# Patient Record
Sex: Female | Born: 1950
Health system: Southern US, Community
[De-identification: ages and names within clinical notes are randomized; demographics above are authoritative.]

## PROBLEM LIST (undated history)

## (undated) DIAGNOSIS — F411 Generalized anxiety disorder: Secondary | ICD-10-CM

## (undated) DIAGNOSIS — E785 Hyperlipidemia, unspecified: Secondary | ICD-10-CM

## (undated) DIAGNOSIS — I2510 Atherosclerotic heart disease of native coronary artery without angina pectoris: Secondary | ICD-10-CM

## (undated) DIAGNOSIS — F209 Schizophrenia, unspecified: Secondary | ICD-10-CM

## (undated) DIAGNOSIS — J449 Chronic obstructive pulmonary disease, unspecified: Secondary | ICD-10-CM

## (undated) DIAGNOSIS — I10 Essential (primary) hypertension: Secondary | ICD-10-CM

## (undated) DIAGNOSIS — E119 Type 2 diabetes mellitus without complications: Secondary | ICD-10-CM

## (undated) HISTORY — DX: Hyperlipidemia, unspecified: E78.5

## (undated) HISTORY — DX: Generalized anxiety disorder: F41.1

## (undated) HISTORY — DX: Chronic obstructive pulmonary disease, unspecified: J44.9

## (undated) HISTORY — DX: Type 2 diabetes mellitus without complications: E11.9

## (undated) HISTORY — DX: Essential (primary) hypertension: I10

## (undated) HISTORY — DX: Schizophrenia, unspecified: F20.9

## (undated) HISTORY — DX: Atherosclerotic heart disease of native coronary artery without angina pectoris: I25.10

---

## 2016-03-31 DIAGNOSIS — I748 Embolism and thrombosis of other arteries: Secondary | ICD-10-CM | POA: Diagnosis not present

## 2016-03-31 DIAGNOSIS — E119 Type 2 diabetes mellitus without complications: Secondary | ICD-10-CM

## 2016-03-31 DIAGNOSIS — I471 Supraventricular tachycardia: Secondary | ICD-10-CM

## 2016-03-31 DIAGNOSIS — J449 Chronic obstructive pulmonary disease, unspecified: Secondary | ICD-10-CM | POA: Diagnosis not present

## 2016-04-02 DIAGNOSIS — I471 Supraventricular tachycardia: Secondary | ICD-10-CM | POA: Diagnosis not present

## 2016-04-02 DIAGNOSIS — E119 Type 2 diabetes mellitus without complications: Secondary | ICD-10-CM | POA: Diagnosis not present

## 2016-04-02 DIAGNOSIS — I748 Embolism and thrombosis of other arteries: Secondary | ICD-10-CM | POA: Diagnosis not present

## 2016-04-02 DIAGNOSIS — J449 Chronic obstructive pulmonary disease, unspecified: Secondary | ICD-10-CM | POA: Diagnosis not present

## 2016-04-30 DIAGNOSIS — I471 Supraventricular tachycardia, unspecified: Secondary | ICD-10-CM

## 2016-04-30 DIAGNOSIS — I739 Peripheral vascular disease, unspecified: Secondary | ICD-10-CM

## 2016-04-30 DIAGNOSIS — E119 Type 2 diabetes mellitus without complications: Secondary | ICD-10-CM

## 2016-04-30 DIAGNOSIS — R0789 Other chest pain: Secondary | ICD-10-CM | POA: Insufficient documentation

## 2016-04-30 DIAGNOSIS — I10 Essential (primary) hypertension: Secondary | ICD-10-CM

## 2016-04-30 HISTORY — DX: Supraventricular tachycardia: I47.1

## 2016-04-30 HISTORY — DX: Peripheral vascular disease, unspecified: I73.9

## 2016-04-30 HISTORY — DX: Type 2 diabetes mellitus without complications: E11.9

## 2016-04-30 HISTORY — DX: Supraventricular tachycardia, unspecified: I47.10

## 2016-04-30 HISTORY — DX: Essential (primary) hypertension: I10

## 2016-04-30 HISTORY — DX: Other chest pain: R07.89

## 2016-05-23 DIAGNOSIS — R413 Other amnesia: Secondary | ICD-10-CM | POA: Insufficient documentation

## 2016-10-16 DIAGNOSIS — F3170 Bipolar disorder, currently in remission, most recent episode unspecified: Secondary | ICD-10-CM

## 2016-10-16 DIAGNOSIS — I10 Essential (primary) hypertension: Secondary | ICD-10-CM

## 2016-10-16 DIAGNOSIS — J159 Unspecified bacterial pneumonia: Secondary | ICD-10-CM

## 2016-10-16 DIAGNOSIS — F17200 Nicotine dependence, unspecified, uncomplicated: Secondary | ICD-10-CM

## 2016-10-16 DIAGNOSIS — E119 Type 2 diabetes mellitus without complications: Secondary | ICD-10-CM

## 2016-10-16 DIAGNOSIS — J441 Chronic obstructive pulmonary disease with (acute) exacerbation: Secondary | ICD-10-CM

## 2016-10-16 DIAGNOSIS — M1990 Unspecified osteoarthritis, unspecified site: Secondary | ICD-10-CM

## 2016-10-16 DIAGNOSIS — I471 Supraventricular tachycardia: Secondary | ICD-10-CM

## 2016-10-16 DIAGNOSIS — J9601 Acute respiratory failure with hypoxia: Secondary | ICD-10-CM

## 2016-10-16 DIAGNOSIS — I748 Embolism and thrombosis of other arteries: Secondary | ICD-10-CM

## 2016-10-16 DIAGNOSIS — G459 Transient cerebral ischemic attack, unspecified: Secondary | ICD-10-CM | POA: Diagnosis not present

## 2016-10-17 DIAGNOSIS — G459 Transient cerebral ischemic attack, unspecified: Secondary | ICD-10-CM | POA: Diagnosis not present

## 2016-10-17 DIAGNOSIS — J9601 Acute respiratory failure with hypoxia: Secondary | ICD-10-CM | POA: Diagnosis not present

## 2016-10-17 DIAGNOSIS — J159 Unspecified bacterial pneumonia: Secondary | ICD-10-CM | POA: Diagnosis not present

## 2016-10-17 DIAGNOSIS — J441 Chronic obstructive pulmonary disease with (acute) exacerbation: Secondary | ICD-10-CM | POA: Diagnosis not present

## 2017-07-15 DIAGNOSIS — J449 Chronic obstructive pulmonary disease, unspecified: Secondary | ICD-10-CM | POA: Insufficient documentation

## 2017-07-15 DIAGNOSIS — I771 Stricture of artery: Secondary | ICD-10-CM | POA: Insufficient documentation

## 2017-07-15 DIAGNOSIS — E785 Hyperlipidemia, unspecified: Secondary | ICD-10-CM | POA: Insufficient documentation

## 2017-07-15 HISTORY — DX: Stricture of artery: I77.1

## 2018-03-03 DIAGNOSIS — Z862 Personal history of diseases of the blood and blood-forming organs and certain disorders involving the immune mechanism: Secondary | ICD-10-CM | POA: Diagnosis not present

## 2018-03-03 DIAGNOSIS — F17218 Nicotine dependence, cigarettes, with other nicotine-induced disorders: Secondary | ICD-10-CM | POA: Diagnosis not present

## 2018-03-03 DIAGNOSIS — Z9081 Acquired absence of spleen: Secondary | ICD-10-CM

## 2018-09-09 ENCOUNTER — Encounter: Payer: Self-pay | Admitting: Neurology

## 2018-12-02 ENCOUNTER — Ambulatory Visit: Payer: Medicare Other | Admitting: Neurology

## 2018-12-17 ENCOUNTER — Other Ambulatory Visit: Payer: Medicare Other

## 2018-12-17 ENCOUNTER — Other Ambulatory Visit: Payer: Self-pay

## 2018-12-17 ENCOUNTER — Encounter: Payer: Self-pay | Admitting: Neurology

## 2018-12-17 ENCOUNTER — Ambulatory Visit (INDEPENDENT_AMBULATORY_CARE_PROVIDER_SITE_OTHER): Payer: Medicare Other | Admitting: Neurology

## 2018-12-17 VITALS — BP 118/62 | HR 68 | Ht 60.0 in | Wt 165.0 lb

## 2018-12-17 DIAGNOSIS — F0281 Dementia in other diseases classified elsewhere with behavioral disturbance: Secondary | ICD-10-CM

## 2018-12-17 DIAGNOSIS — R413 Other amnesia: Secondary | ICD-10-CM | POA: Diagnosis not present

## 2018-12-17 DIAGNOSIS — F02818 Dementia in other diseases classified elsewhere, unspecified severity, with other behavioral disturbance: Secondary | ICD-10-CM

## 2018-12-17 MED ORDER — DONEPEZIL HCL 5 MG PO TABS: ORAL_TABLET | ORAL | 11 refills | Status: DC

## 2018-12-17 NOTE — Progress Notes (Signed)
NEUROLOGY CONSULTATION NOTE  Jody Livingston MRN: [id number] DOB: November 02, 1950  Referring provider: Mikael Spray, NP Primary care provider: Mikael Spray, NP  Reason for consult:  Epilepsy; depakote level <4  Thank you for your kind referral of Jody Livingston for consultation of the above symptoms. Although her history is well known to you, please allow me to reiterate it for the purpose of our medical record. The patient was accompanied to the clinic by her daughter Jody Livingston who also provides collateral information. Records and images were personally reviewed where available.  HISTORY OF PRESENT ILLNESS: This is a 68 year old left-handed woman with a history of hypertension, hyperlipidemia, diabetes, CAD, schizophrenia, presenting for evaluation of epilepsy and depakote level <4. The patient and her daughter Jody Livingston deny any prior history of epilepsy or seizures. They both deny any prior history of convulsions, staring/unresponsive episodes, or any seizure-like symptoms. There are no prior records for review. She states the Depakote is being prescribed by her psychiatrist Dr. Bayard Males. She states that "I was complaining about memory loss." Jody Livingston does not provide much information and states she does not know much because the patient lives with her other daughter, Jody Livingston. Jody Livingston tried to get in contact with Jody Livingston to provide additional information, and Jody Livingston sent a message: "she sees Dr. Bayard Males for schizophrenia, dementia, COPD, tachycardia, hypertension, diabetes. Why is she not remembering things correctly when supposedly taking all her medication?" The patient states that Jody Livingston lives with her, that the patient pays the rent and utilities and denies missing bills. She stopped driving 2 years ago, it is unclear what exactly occurred, but she denies getting lost and states family would not bring her. Jody Livingston reports she stopped working 25 years ago, she was driving and did not know where  she was going, she would go to work and keep driving, not knowing where she would end up. Jody Livingston reports she forgets to eat. She states she sometimes forgets her medications, and that Jody Livingston does not help her. She puts her medications in a pill caddy and states that her sone comes and "moves them every which way, not in the right places." She states that "he says I don't know what I am doing" and insisted to take her to a psychiatrist. Each time she goes to Dr. Bayard Males and complains about side effects of Depakote, "he asks me to increase it, they are trying to prescribe 4 but I am only taking 1." She states she only started seeing a psychiatrist 2 years ago and that prior to this she had never seen one. Her daughter reminds her that she kept going to the inpatient [facility] hospital in [place] 10 years ago and was discharged home on Risperdal.   She denies any headaches, dizziness, diplopia, dysarthria/dysphagia, neck/back pain, focal numbness/tingling/weakness, bowel/bladder dysfunction, tremors. She has noticed a decreased sense of smell. She has had bedwetting since childhood. She denies any olfactory/gustatory hallucinations, rising epigastric sensation, myoclonic jerks. No family history of dementia or seizures. No history of significant head injuries, alcohol intake. No history of febrile convulsions, CNS infections, neurosurgical procedures.   PAST MEDICAL HISTORY: Past Medical History:  Diagnosis Date  . Diabetes mellitus without complication (HCC)   . Hyperlipidemia   . Schizophrenia (HCC)     PAST SURGICAL HISTORY: History reviewed. No pertinent surgical history.  MEDICATIONS:  Outpatient Encounter Medications as of 12/17/2018  Medication Sig  . atenolol (TENORMIN) 25 MG tablet Take 25 mg by mouth daily.  . clopidogrel (PLAVIX) 75  MG tablet Take 75 mg by mouth daily.  . COMBIVENT RESPIMAT 20-100 MCG/ACT AERS respimat INHALE 1 PUFF BY MOUTH 4 TIMES DAILY  . D3-1000 25 MCG (1000  UT) capsule Take 1,000 Units by mouth daily.  Marland Kitchen diltiazem (CARDIZEM CD) 180 MG 24 hr capsule Take 180 mg by mouth daily.  . divalproex (DEPAKOTE ER) 250 MG 24 hr tablet Take 750 mg by mouth at bedtime.   Marland Kitchen glimepiride (AMARYL) 4 MG tablet TAKE ONE TABLET BY MOUTH EVERY MORNING FOR DIABETES  . metFORMIN (GLUCOPHAGE) 500 MG tablet Take 500 mg by mouth daily.  . montelukast (SINGULAIR) 10 MG tablet Take 10 mg by mouth daily.  . risperiDONE (RISPERDAL) 2 MG tablet Take 2 mg by mouth at bedtime.  . rosuvastatin (CRESTOR) 10 MG tablet Take 10 mg by mouth daily.  Marland Kitchen donepezil (ARICEPT) 5 MG tablet Take 1 tablet daily   No facility-administered encounter medications on file as of 12/17/2018.     ALLERGIES: No Known Allergies  FAMILY HISTORY: History reviewed. No pertinent family history.  SOCIAL HISTORY: Social History   Socioeconomic History  . Marital status: Single    Spouse name: Not on file  . Number of children: Not on file  . Years of education: Not on file  . Highest education level: Not on file  Occupational History  . Not on file  Social Needs  . Financial resource strain: Not on file  . Food insecurity:    Worry: Not on file    Inability: Not on file  . Transportation needs:    Medical: Not on file    Non-medical: Not on file  Tobacco Use  . Smoking status: Unknown If Ever Smoked  Substance and Sexual Activity  . Alcohol use: Not on file  . Drug use: Not on file  . Sexual activity: Not on file  Lifestyle  . Physical activity:    Days per week: Not on file    Minutes per session: Not on file  . Stress: Not on file  Relationships  . Social connections:    Talks on phone: Not on file    Gets together: Not on file    Attends religious service: Not on file    Active member of club or organization: Not on file    Attends meetings of clubs or organizations: Not on file    Relationship status: Not on file  . Intimate partner violence:    Fear of current or ex  partner: Not on file    Emotionally abused: Not on file    Physically abused: Not on file    Forced sexual activity: Not on file  Other Topics Concern  . 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    REVIEW OF SYSTEMS: Constitutional: No fevers, chills, or sweats, no generalized fatigue, change in appetite Eyes: No visual changes, double vision, eye pain Ear, nose and throat: No hearing loss, ear pain, nasal congestion, sore throat Cardiovascular: No chest pain, palpitations Respiratory:  No shortness of breath at rest or with exertion, wheezes GastrointestinaI: No nausea, vomiting, diarrhea, abdominal pain, fecal incontinence Genitourinary:  No dysuria, urinary retention or frequency Musculoskeletal:  No neck pain, back pain Integumentary: No rash, pruritus, skin lesions Neurological: as above Psychiatric: No depression, insomnia, anxiety Endocrine: No palpitations,  fatigue, diaphoresis, mood swings, change in appetite, change in weight, increased thirst Hematologic/Lymphatic:  No anemia, purpura, petechiae. Allergic/Immunologic: no itchy/runny eyes, nasal congestion, recent allergic reactions, rashes  PHYSICAL EXAM: Vitals:   12/17/18 1355  BP: 118/62  Pulse: 68  SpO2: 91%   General: No acute distress Head:  Normocephalic/atraumatic Eyes: Fundoscopic exam shows bilateral sharp discs, no vessel changes, exudates, or hemorrhages Neck: supple, no paraspinal tenderness, full range of motion Back: No paraspinal tenderness Heart: regular rate and rhythm Lungs: Clear to auscultation bilaterally. Vascular: No carotid bruits. Skin/Extremities: No rash, no edema Neurological Exam: Mental status: alert and oriented to person, place, and time, no dysarthria or aphasia, Fund of knowledge is appropriate.  Recent and remote memory are  impaired.  Attention and concentration are normal.    Able to name objects and repeat phrases. Able to name 5 F words in 1 minute (nl >11). CDT 5/5 [name] - Mini Mental State Exam 12/17/2018  Orientation to time 5  Orientation to Place 4  Registration 3  Attention/ Calculation 3  Recall 0  Language- name 2 objects 2  Language- repeat 1  Language- follow 3 step command 3  Language- read & follow direction 1  Write a sentence 1  Copy design 0  Total score 23   Cranial nerves: CN I: not tested CN II: pupils equal, round and reactive to light, visual fields intact, fundi unremarkable. CN III, IV, VI:  full range of motion, no nystagmus, no ptosis CN V: facial sensation intact CN VII: upper and lower face symmetric CN VIII: hearing intact to finger rub CN IX, X: gag intact, uvula midline CN XI: sternocleidomastoid and trapezius muscles intact CN XII: tongue midline Bulk & Tone: normal, no fasciculations. Motor: 5/5 throughout with no pronator drift. Sensation: intact to light touch, cold, pin, vibration and joint position sense.  No extinction to double simultaneous stimulation.  Romberg test negative Deep Tendon Reflexes: +2 throughout, no ankle clonus Plantar responses: downgoing bilaterally Cerebellar: no incoordination on finger to nose, heel to shin. No dysdiadochokinesia Gait: narrow-based and steady, able to tandem walk adequately. Tremor: none  IMPRESSION: This is a 68 year old left-handed woman with a history of hypertension, hyperlipidemia, diabetes, CAD, schizophrenia, presenting for evaluation of epilepsy and depakote level <4. The patient and her daughter deny any history of epilepsy or seizures, there are no records on EPIC about this. There are no clear symptoms to indicate she has had seizures. She is on Depakote for schizophrenia and has a subtherapeutic level because she states she is only taking 1 tablet despite being instructed to take 4. It appears she is here for  memory concerns, [name] today 23/30. On further questioning, memory issues seem to date back to 25 years ago and may relate as well to underlying psychiatric condition. She has a diagnosis of schizophrenia, unclear if she has bipolar disorder as well, ndividuals with bipolar disorder are more likely to develop dementia as they age, and this typically involves frontal-subcortical pattern of deficits. Bloodwork for TSH and B12 will be ordered, MRI brain with and without contrast will be done to assess for underlying structural abnormality. She is agreeable to starting Donepezil 5mg  daily, side effects and expectations from the medication were discussed. She was advised to start using a Pillpack to help with medication management since she seems suspicious of her children helping her. Continue follow-up with Psychiatry. She does not drive. Follow-up in 6 months, call for any changes.   Thank you  for allowing me to participate in the care of this patient. Please do not hesitate to call for any questions or concerns.   Patrcia Dolly, M.D.  CC: Mikael Spray, NP

## 2018-12-17 NOTE — Patient Instructions (Addendum)
1. Bloodwork for TSH, B12  Your provider requests that you have LABS drawn today.  We share a lab with [facility] - they are located in [address] (second floor) of this building.  Once you get there, please have a seat and the phlebotomist will call your name.  If you have waited more than 15 minutes, please advise the front desk  2. Schedule MRI brain with and without contrast  We have sent a referral to [facility] for your MRI and they will call you directly to schedule your appt. They are located at [address] [facility]. If you need to contact them directly please call [phone].  3. Start Donepezil 5mg  daily to help slow down worsening of memory 4. Recommend doing Pillpack to help with organizing medications. Look into [email or web address] 5. Continue working with your psychiatrst 6. Follow-up in 6 months, call for any changes  FALL PRECAUTIONS: Be cautious when walking. Scan the area for obstacles that may increase the risk of trips and falls. When getting up in the mornings, sit up at the edge of the bed for a few minutes before getting out of bed. Consider elevating the bed at the head end to avoid drop of blood pressure when getting up. Walk always in a well-lit room (use night lights in the walls). Avoid area rugs or power cords from appliances in the middle of the walkways. Use a walker or a cane if necessary and consider physical therapy for balance exercise. Get your eyesight checked regularly.  HOME SAFETY: Consider the safety of the kitchen when operating appliances like stoves, microwave oven, and blender. Consider having supervision and share cooking responsibilities until no longer able to participate in those. Accidents with firearms and other hazards in the house should be identified and addressed as well.  ABILITY TO BE LEFT ALONE: If patient is unable to contact 911 operator, consider using LifeLine, or when the need is there, arrange for someone to stay with  patients. Smoking is a fire hazard, consider supervision or cessation. Risk of wandering should be assessed by caregiver and if detected at any point, supervision and safe proof recommendations should be instituted.  MEDICATION SUPERVISION: Inability to self-administer medication needs to be constantly addressed. Implement a mechanism to ensure safe administration of the medications.  RECOMMENDATIONS FOR ALL PATIENTS WITH MEMORY PROBLEMS: 1. Continue to exercise (Recommend 30 minutes of walking everyday, or 3 hours every week) 2. Increase social interactions - continue going to [place] and enjoy social gatherings with friends and family 3. Eat healthy, avoid fried foods and eat more fruits and vegetables 4. Maintain adequate blood pressure, blood sugar, and blood cholesterol level. Reducing the risk of stroke and cardiovascular disease also helps promoting better memory. 5. Avoid stressful situations. Live a simple life and avoid aggravations. Organize your time and prepare for the next day in anticipation. 6. Sleep well, avoid any interruptions of sleep and avoid any distractions in the bedroom that may interfere with adequate sleep quality 7. Avoid sugar, avoid sweets as there is a strong link between excessive sugar intake, diabetes, and cognitive impairment The Mediterranean diet has been shown to help patients reduce the risk of progressive memory disorders and reduces cardiovascular risk. This includes eating fish, eat fruits and green leafy vegetables, nuts like almonds and hazelnuts, walnuts, and also use olive oil. Avoid fast foods and fried foods as much as possible. Avoid sweets and sugar as sugar use has been linked to worsening of memory function.

## 2018-12-18 LAB — VITAMIN B12: VITAMIN B 12: 518 pg/mL (ref 200–1100)

## 2018-12-18 LAB — TSH: TSH: 2.63 mIU/L (ref 0.40–4.50)

## 2018-12-19 ENCOUNTER — Telehealth: Payer: Self-pay

## 2018-12-19 NOTE — Telephone Encounter (Signed)
LMOM advising pt of normal results below.

## 2018-12-19 NOTE — Telephone Encounter (Signed)
-----   Message from Van Clines, MD sent at 12/18/2018 11:12 PM EST ----- Pls let her know thyroid and B12 levels are normal. Thanks

## 2018-12-23 ENCOUNTER — Encounter: Payer: Self-pay | Admitting: Neurology

## 2018-12-31 ENCOUNTER — Telehealth: Payer: Self-pay | Admitting: Neurology

## 2018-12-31 NOTE — Telephone Encounter (Signed)
Noted  

## 2018-12-31 NOTE — Telephone Encounter (Signed)
Patient's daughter in law called on her behalf. She is not listed on the DPR. She said she would get her Daughter to call back to speak with you. She said Talishia said she missed a call from our office? She also has told her family that she was to stop taking her medication (All but one). Please Advise. Thanks

## 2019-01-20 ENCOUNTER — Other Ambulatory Visit: Payer: Self-pay

## 2019-01-20 ENCOUNTER — Ambulatory Visit
Admission: RE | Admit: 2019-01-20 | Discharge: 2019-01-20 | Disposition: A | Discharge status: Home / Self Care | Payer: Medicare Other | Source: Ambulatory Visit | Attending: Neurology | Admitting: Neurology

## 2019-01-20 DIAGNOSIS — R413 Other amnesia: Secondary | ICD-10-CM

## 2019-01-20 MED ORDER — GADOBENATE DIMEGLUMINE 529 MG/ML IV SOLN
15.0000 mL | Freq: Once | INTRAVENOUS | Status: AC | PRN
  Administered 2019-01-20: 15 mL via INTRAVENOUS

## 2019-01-27 ENCOUNTER — Other Ambulatory Visit: Payer: Medicare Other

## 2019-02-09 ENCOUNTER — Telehealth: Payer: Self-pay | Admitting: Neurology

## 2019-02-09 NOTE — Telephone Encounter (Signed)
Please advise 

## 2019-02-09 NOTE — Telephone Encounter (Signed)
Left message with answering service regarding needing to get MRI results. Please Call. Thanks

## 2019-02-09 NOTE — Telephone Encounter (Signed)
See below:  Notes recorded by Van Clines, MD on 01/23/2019 at 11:04 AM EDT Pls let patient/daughter know the MRI brain did not show any evidence of tumor, stroke,or bleed. It showed age-related changes, similar to prior scan in 2017. Thanks  Thanks!

## 2019-02-09 NOTE — Telephone Encounter (Signed)
Contacted patient daughter and went over resutls. She repeated back what was said verbatum and verbalized understanding

## 2019-07-28 ENCOUNTER — Ambulatory Visit: Payer: Medicare Other | Admitting: Neurology

## 2019-07-30 ENCOUNTER — Encounter: Payer: Self-pay | Admitting: Neurology

## 2019-07-30 ENCOUNTER — Telehealth (INDEPENDENT_AMBULATORY_CARE_PROVIDER_SITE_OTHER): Payer: Medicare Other | Admitting: Neurology

## 2019-07-30 ENCOUNTER — Other Ambulatory Visit: Payer: Self-pay

## 2019-07-30 VITALS — Ht 60.0 in | Wt 147.0 lb

## 2019-07-30 DIAGNOSIS — F02818 Dementia in other diseases classified elsewhere, unspecified severity, with other behavioral disturbance: Secondary | ICD-10-CM

## 2019-07-30 DIAGNOSIS — F0281 Dementia in other diseases classified elsewhere with behavioral disturbance: Secondary | ICD-10-CM

## 2019-07-30 DIAGNOSIS — R413 Other amnesia: Secondary | ICD-10-CM

## 2019-07-30 MED ORDER — DONEPEZIL HCL 10 MG PO TABS: 10.0000 mg | ORAL_TABLET | Freq: Every day | ORAL | 3 refills | Status: DC

## 2019-07-30 NOTE — Progress Notes (Signed)
Virtual Visit via Video Note The purpose of this virtual visit is to provide medical care while limiting exposure to the novel coronavirus.    Consent was obtained for video visit:  Yes.   Answered questions that patient had about telehealth interaction:  Yes.   I discussed the limitations, risks, security and privacy concerns of performing an evaluation and management service by telemedicine. I also discussed with the patient that there may be a patient responsible charge related to this service. The patient expressed understanding and agreed to proceed.  Pt location: Home Physician Location: office Name of referring provider:  Welford Roche, NP I connected with Jody Livingston at patients initiation/request on 07/30/2019 at 10:00 AM EDT by video enabled telemedicine application and verified that I am speaking with the correct person using two identifiers. Pt MRN:  [id number] Pt DOB:  [birth year] Video Participants:  Jody Livingston;  Jody Livingston (daughter)   History of Present Illness:  The patient was seen as a virtual video visit on 07/30/2019. She was last seen 8 months ago for evaluation of "epilepsy and a depakote level <4," but on further questioning there is no history of seizures, she is on depakote for mood stabilization. Main concern from her other daughter Jody Livingston was memory loss. She was previously living with Jody Livingston, but today reports that she is now living alone. Her daughter Jody Livingston is again present during the visit and is unable to provide any history, stating she does not know much because she is not around her much. It is unclear if Jody Livingston is unable to be more forthcoming. On her initial visit, [name] was 23/30. I personally reviewed MRI brain without contrast done 12/2018 which did not show any acute changes, there was mild diffuse atrophy and chronic microvascular disease. TSH and B12 normal. She was started on Donepezil 5mg  daily which she is tolerating without side  effects. She states her memory is not good. Medications now come in a pillpack which has helped track it down better, but she still misses doses, usually the night medications. She does not drive. She does not cook. She denies missing bill payments. Jody Livingston is unable to confirm information. She continues to see Psychiatry. She denies any headaches, dizziness, vision changes, focal numbness/tingling/weakness, no falls.   History on Initial Assessment 12/17/2018: This is a 68 year old left-handed woman with a history of hypertension, hyperlipidemia, diabetes, CAD, schizophrenia, presenting for evaluation of epilepsy and depakote level <4. The patient and her daughter Jody Livingston deny any prior history of epilepsy or seizures. They both deny any prior history of convulsions, staring/unresponsive episodes, or any seizure-like symptoms. There are no prior records for review. She states the Depakote is being prescribed by her psychiatrist Jody Livingston. She states that "I was complaining about memory loss." Jody Livingston does not provide much information and states she does not know much because the patient lives with her other daughter, Jody Livingston. Jody Livingston tried to get in contact with Jody Livingston to provide additional information, and Jody Livingston sent a message: "she sees Jody Livingston for schizophrenia, dementia, COPD, tachycardia, hypertension, diabetes. Why is she not remembering things correctly when supposedly taking all her medication?" The patient states that Jody Livingston lives with her, that the patient pays the rent and utilities and denies missing bills. She stopped driving 2 years ago, it is unclear what exactly occurred, but she denies getting lost and states family would not bring her. Jody Livingston reports she stopped working 25 years ago, she was driving and did not  know where she was going, she would go to work and keep driving, not knowing where she would end up. Jody Livingston reports she forgets to eat. She states she sometimes forgets her  medications, and that Jody Livingston does not help her. She puts her medications in a pill caddy and states that her sone comes and "moves them every which way, not in the right places." She states that "he says I don't know what I am doing" and insisted to take her to a psychiatrist. Each time she goes to Jody Livingston and complains about side effects of Depakote, "he asks me to increase it, they are trying to prescribe 4 but I am only taking 1." She states she only started seeing a psychiatrist 2 years ago and that prior to this she had never seen one. Her daughter reminds her that she kept going to the inpatient Psychiatric hospital in [place] 10 years ago and was discharged home on Risperdal.   She denies any headaches, dizziness, diplopia, dysarthria/dysphagia, neck/back pain, focal numbness/tingling/weakness, bowel/bladder dysfunction, tremors. She has noticed a decreased sense of smell. She has had bedwetting since childhood. She denies any olfactory/gustatory hallucinations, rising epigastric sensation, myoclonic jerks. No family history of dementia or seizures. No history of significant head injuries, alcohol intake. No history of febrile convulsions, CNS infections, neurosurgical procedures.      Current Outpatient Medications on File Prior to Visit  Medication Sig Dispense Refill   clopidogrel (PLAVIX) 75 MG tablet Take 75 mg by mouth daily.     COMBIVENT RESPIMAT 20-100 MCG/ACT AERS respimat INHALE 1 PUFF BY MOUTH 4 TIMES DAILY     D3-1000 25 MCG (1000 UT) capsule Take 1,000 Units by mouth daily.     diltiazem (CARDIZEM CD) 180 MG 24 hr capsule Take 180 mg by mouth daily.     divalproex (DEPAKOTE ER) 250 MG 24 hr tablet Take 750 mg by mouth at bedtime.      donepezil (ARICEPT) 5 MG tablet Take 1 tablet daily 30 tablet 11   glimepiride (AMARYL) 4 MG tablet TAKE ONE TABLET BY MOUTH EVERY MORNING FOR DIABETES     metFORMIN (GLUCOPHAGE) 500 MG tablet Take 500 mg by mouth daily.      montelukast (SINGULAIR) 10 MG tablet Take 10 mg by mouth daily.     risperiDONE (RISPERDAL) 0.5 MG tablet Take 0.5 mg by mouth at bedtime.     risperidone (RISPERDAL) 4 MG tablet Take 4 mg by mouth 2 (two) times daily.     rosuvastatin (CRESTOR) 10 MG tablet Take 10 mg by mouth daily.     No current facility-administered medications on file prior to visit.      Observations/Objective:   Vitals:   07/30/19 0932  Weight: 147 lb (66.7 kg)  Height: 5' (1.524 m)   GEN:  The patient appears stated age and is in NAD.  Neurological examination: Patient is awake, alert, oriented x 2. No aphasia or dysarthria. Reduced fluency, able to follow commands. Remote and recent memory impaired. Able to repeat. Cranial nerves: Extraocular movements intact with no nystagmus. No facial asymmetry. Motor: moves all extremities symmetrically, at least anti-gravity x 4.   Montreal Cognitive Assessment Blind 07/30/2019  Attention: Read list of digits (0/2) 2  Attention: Read list of letters (0/1) 1  Attention: Serial 7 subtraction starting at 100 (0/3) 0  Language: Repeat phrase (0/2) 1  Language : Fluency (0/1) 0  Abstraction (0/2) 0  Delayed Recall (0/5) 0  Orientation (0/6) 3  Total 7  Adjusted Score (based on education) 8    Assessment and Plan:   This is a 68 yo LH woman with a history of hypertension, hyperlipidemia, diabetes, CAD, schizophrenia, who initially presented for evaluation of epilepsy and depakote level <4, but on further questioning, she has no history of epilepsy or seizures and takes Depakote for mood stabilization. It appears the main concern is memory, which seem to date back to 25 years ago and may relate as well to underlying psychiatric condition. She has a diagnosis of schizophrenia, unclear if she has bipolar disorder as well, individuals with bipolar disorder are more likely to develop dementia as they age, and this typically involves frontal-subcortical pattern of deficits. MRI  brain unremarkable. Her MOCA blind (done over phone) today is 8/22 ([name] 23/30 in February 2020). She is on Donepezil 5mg  daily, increase to 10mg  daily. She is now living alone, unclear if due to issues with family, it appears her other daughter present today Jody PenSherry is unable to be more forthcoming with information. She will be scheduled for Neurocognitive testing to further evaluate memory concerns to help guide long-term management. Continue follow-up with Psychiatry. She does not drive. Follow-up in 6 months, call for any changes.    Follow Up Instructions:   -I discussed the assessment and treatment plan with the patient. The patient was provided an opportunity to ask questions and all were answered. The patient agreed with the plan and demonstrated an understanding of the instructions.   The patient was advised to call back or seek an in-person evaluation if the symptoms worsen or if the condition fails to improve as anticipated.    Van ClinesKaren M Thelton Graca, MD

## 2019-08-31 ENCOUNTER — Encounter: Payer: Self-pay | Admitting: Psychology

## 2019-08-31 ENCOUNTER — Ambulatory Visit (INDEPENDENT_AMBULATORY_CARE_PROVIDER_SITE_OTHER): Payer: Medicare Other | Admitting: Psychology

## 2019-08-31 ENCOUNTER — Ambulatory Visit: Payer: Medicare Other

## 2019-08-31 ENCOUNTER — Other Ambulatory Visit: Payer: Self-pay

## 2019-08-31 DIAGNOSIS — I10 Essential (primary) hypertension: Secondary | ICD-10-CM | POA: Insufficient documentation

## 2019-08-31 DIAGNOSIS — F209 Schizophrenia, unspecified: Secondary | ICD-10-CM

## 2019-08-31 DIAGNOSIS — E119 Type 2 diabetes mellitus without complications: Secondary | ICD-10-CM | POA: Insufficient documentation

## 2019-08-31 DIAGNOSIS — J449 Chronic obstructive pulmonary disease, unspecified: Secondary | ICD-10-CM | POA: Insufficient documentation

## 2019-08-31 DIAGNOSIS — E785 Hyperlipidemia, unspecified: Secondary | ICD-10-CM

## 2019-08-31 DIAGNOSIS — F411 Generalized anxiety disorder: Secondary | ICD-10-CM

## 2019-08-31 DIAGNOSIS — F0281 Dementia in other diseases classified elsewhere with behavioral disturbance: Secondary | ICD-10-CM

## 2019-08-31 DIAGNOSIS — F02818 Dementia in other diseases classified elsewhere, unspecified severity, with other behavioral disturbance: Secondary | ICD-10-CM

## 2019-08-31 DIAGNOSIS — I2510 Atherosclerotic heart disease of native coronary artery without angina pectoris: Secondary | ICD-10-CM | POA: Insufficient documentation

## 2019-08-31 DIAGNOSIS — G3184 Mild cognitive impairment, so stated: Secondary | ICD-10-CM | POA: Diagnosis not present

## 2019-08-31 HISTORY — DX: Mild cognitive impairment of uncertain or unknown etiology: G31.84

## 2019-08-31 NOTE — Progress Notes (Signed)
   Neuropsychology Note   Rene Gonsoulin completed 100 minutes of neuropsychological testing with technician, Cruzita Lederer, B.S., under the supervision of Dr. Christia Reading, Ph.D., licensed neuropsychologist. The patient did not appear overtly distressed by the testing session, per behavioral observation or via self-report to the technician. Rest breaks were offered.    In considering the patient's current level of functioning, level of presumed impairment, nature of symptoms, emotional and behavioral responses during the interview, level of literacy, and observed level of motivation/effort, a battery of tests was selected and communicated to the psychometrician.   Communication between the psychologist and technician was ongoing throughout the testing session and changes were made as deemed necessary based on patient performance on testing, technician observations and additional pertinent factors such as those listed above.   Jody Livingston will return within approximately two weeks for an interactive feedback session with Dr. Melvyn Novas at which time his test performances, clinical impressions, and treatment recommendations will be reviewed in detail. The patient understands she can contact our office should she require our assistance before this time.   Full report to follow.  100 minutes were spent face-to-face with patient administering standardized tests and 15 minutes were spent scoring (technician). [CPT [id number], [id number]]

## 2019-08-31 NOTE — Progress Notes (Signed)
NEUROPSYCHOLOGICAL EVALUATION [place]. [facility] Department of Neurology  Reason for Referral:   Jakera Beaupre is a 68 y.o. Caucasian female referred by Ellouise Newer, M.D., to characterize her current cognitive functioning and assist with diagnostic clarity and treatment planning in the context of subjective cognitive decline and a history of several psychiatric comorbidities.  Assessment and Plan:   Clinical Impression(s): Ms. Dwight' pattern of performance is suggestive of primary impairments across verbal and visual memory, including exhibiting an amnestic profile across all administered memory tests. Additional weaknesses were noted across verbal fluency (semantic worse than phonemic) and response inhibition; variability was exhibited across processing speed. Performance was within normal limits across domains of attention/concentration, other aspects of executive functioning, receptive language, confrontation naming, and visuospatial abilities. Overall, given evidence for cognitive dysfunction, coupled with Ms. Klang' report of intact ability to complete activities of daily living (ADLs), she meets criteria for a Mild Neurocognitive Disorder (formerly "mild cognitive impairment") at the present time.  Regarding etiology, [name]' amnestic memory profile, coupled with low scores across a semantic fluency task, are certainly concerning for Alzheimer's disease. However, intact performance across confrontation naming and visuospatial abilities is inconsistent with the typical presentation of this disease and could suggest that she is at the early stages presently. Continued medical monitoring will be important moving forward. Importantly, a history of schizophrenia has been shown to be associated with cognitive deficits at times. Likewise, mild psychiatric distress (i.e., mild to moderate anxiety) can also influence overall cognitive efficiency. However, poor  memory performance is above and beyond what would typically be expected of these conditions alone.   Recommendations: A repeat neuropsychological evaluation in 12-18 months (or sooner if functional decline is noted) is recommended to assess the trajectory of future cognitive decline should it occur. This will also aid in future efforts towards improved diagnostic clarity.  A combination of medication and psychotherapy has been shown to be most effective at treating symptoms of anxiety. As such, Ms. Vogl is encouraged to speak with her prescribing physician regarding medication adjustments to optimally manage these symptoms. Likewise, Ms. Hawkey may consider engaging in short-term psychotherapy to address symptoms of psychiatric distress. If desired, she would benefit from an active and collaborative therapeutic environment, rather than one purely supportive in nature. Recommended treatment modalities include Cognitive Behavioral Therapy (CBT) or Acceptance and Commitment Therapy (ACT). Given memory deficits, therapy would likely have to be altered to rely on written instructions/handouts heavily.   It will be important for Ms. Mooney to have another person with her when in situations where she may need to process information, weigh the pros and cons of different options, and make decisions, in order to ensure that she fully understands and recalls all information to be considered. This information should be provided to Ms. Maffei in written format in all instances to promote her understanding, later recollection, and adherence.  To address problems with processing speed, she may wish to consider:   -Scheduling more difficult activities for a time of day when she is usually most alert   -Ensuring that she is alerted when essential material or instructions are being presented   -Adjusting the speed at which new information is presented   -Allowing additional processing time or a chance to rehearse  novel information   -Allowing for more time in comprehending, processing, and responding in conversation   -Repeating and paraphrasing instructions or conversations aloud  Reducing anxiety may also aid in the retrieval of information. Ms.  Selley is encouraged to prepare scripts she can use socially when she experiences difficulty with word finding or memory. Such scripts should be brief explanations of the difficulty (e.g., "the word escapes me now") and allow her to move the conversation forward quickly rather than dwelling on the issue.  Review of Records:   Ms. Hoobler was seen by [facility] Patrcia Dolly, M.D.) on 07/30/2019 for an evaluation of memory loss. Symptoms were generalized in nature and said to potentially date back 25 years, possibly related to an underlying psychiatric condition. Records suggest a history of schizophrenia and several psychiatric hospitalizations approximately 10 years ago. Bipolar disorder was also suspected, but never diagnosed/confirmed. Ms. Stoltz was said to live alone and ADLs were described as largely intact. She did acknowledge forgetting to take medications at times, generally her afternoon or evening dosages. Performance on a brief cognitive screening instrument over the telephone (blind MOCA) was abnormal (8/22). Previous performance on the [name] was also abnormal (23/30). Ultimately, Ms. Erich was referred for a comprehensive neuropsychological evaluation to characterize her cognitive abilities and to assist with diagnostic clarity and treatment planning.   Brain MRI on 01/20/2019 revealed atrophy and small vessel disease with no significant progression relative to 2017 imaging (this was unavailable for review).  Past Medical History:  Diagnosis Date   CAD (coronary artery disease)    COPD (chronic obstructive pulmonary disease)    Diabetes mellitus without complication    Generalized anxiety disorder    Hyperlipidemia    Hypertension     Schizophrenia     No past surgical history on file.  No family history on file.   Current Outpatient Medications:    clopidogrel (PLAVIX) 75 MG tablet, Take 75 mg by mouth daily., Disp: , Rfl:    COMBIVENT RESPIMAT 20-100 MCG/ACT AERS respimat, INHALE 1 PUFF BY MOUTH 4 TIMES DAILY, Disp: , Rfl:    D3-1000 25 MCG (1000 UT) capsule, Take 1,000 Units by mouth daily., Disp: , Rfl:    diltiazem (CARDIZEM CD) 180 MG 24 hr capsule, Take 180 mg by mouth daily., Disp: , Rfl:    divalproex (DEPAKOTE ER) 250 MG 24 hr tablet, Take 750 mg by mouth at bedtime. , Disp: , Rfl:    donepezil (ARICEPT) 10 MG tablet, Take 1 tablet (10 mg total) by mouth at bedtime. Take 1 tablet daily, Disp: 90 tablet, Rfl: 3   glimepiride (AMARYL) 4 MG tablet, TAKE ONE TABLET BY MOUTH EVERY MORNING FOR DIABETES, Disp: , Rfl:    metFORMIN (GLUCOPHAGE) 500 MG tablet, Take 500 mg by mouth daily., Disp: , Rfl:    montelukast (SINGULAIR) 10 MG tablet, Take 10 mg by mouth daily., Disp: , Rfl:    risperiDONE (RISPERDAL) 0.5 MG tablet, Take 0.5 mg by mouth at bedtime., Disp: , Rfl:    risperidone (RISPERDAL) 4 MG tablet, Take 4 mg by mouth 2 (two) times daily., Disp: , Rfl:    rosuvastatin (CRESTOR) 10 MG tablet, Take 10 mg by mouth daily., Disp: , Rfl:   Clinical Interview:   Cognitive Symptoms: Decreased short-term memory: Endorsed. Ms. Reber reported symptoms of generalized forgetfulness and was largely unable to provide specific examples. Upon further questioning, she acknowledged trouble remembering the details of previous conversations, but denied trouble misplacing items or forgetting names of familiar individuals. She was also unclear as to how long these difficulties have been present, stating that she first saw Dr. Karel Jarvis approximately 6-12 months prior.  Decreased long-term memory: Denied. Decreased attention/concentration: Denied. Increased  ease of distractibility: Endorsed. These were said to occur  occasionally and she was unclear if difficulties had increased in frequency or severity. Reduced processing speed: Denied. Difficulties with executive functions: Endorsed. Longstanding difficulties with organization were reported by her daughter. However, Ms. Meritt noted that despite it seeming disorganized to others, she has no trouble keeping track of her things. She did report occasional difficulty with indecisiveness. Trouble with impulsivity or using good judgment were denied.  Difficulties with emotion regulation: Denied. Difficulties with receptive language: Denied. Difficulties with word finding: Denied. Decreased visuoperceptual ability: Denied.  Difficulties completing ADLs: Largely denied. Ms. Burry currently lives alone. She denied trouble managing personal finances, as well as taking her morning medications. However, she did acknowledge occasional trouble remembering to take her afternoon or evening dosages. She does not currently drive and has not for the past 2 years. She did not provide a specific reason for why she stopped driving, but denied any accidents or levels of cognitive dysfunction that led to this decision.   Additional Medical History: History of traumatic brain injury/concussion: Denied. History of stroke: Denied. History of seizure activity: Denied. History of known exposure to toxins: Denied. Symptoms of chronic pain: Endorsed. Generalized "aches and pains" throughout her body were described as manageable overall. Experience of frequent headaches/migraines: Denied. Frequent instances of dizziness/vertigo: Denied.  Sensory changes: Denied. Balance/coordination difficulties: Denied. Other motor difficulties: Denied.  Sleep History: Estimated hours obtained each night: 8-9 hours. Difficulties falling asleep: Denied. Difficulties staying asleep: Denied. Feels rested and refreshed upon awakening: Endorsed.  History of snoring: Denied. History of waking up  gasping for air: Denied. Witnessed breath cessation while asleep: Denied.  History of vivid dreaming: Denied. Excessive movement while asleep: Denied. Instances of acting out her dreams: Denied.  Psychiatric/Behavioral Health History: Depression: Denied. Ms. Scatena described her mood as "good" and denied being previously diagnosed with major depressive disorder. Current or remote suicidal ideation, intent, or plan was also denied. Anxiety: Endorsed. Remote symptoms of likely generalized anxiety disorder were reported, attributed to life stressors while raising her children. Currently, she stated that these symptoms are no longer an issue and that she does not require medication intervention.  Mania: Denied. Trauma History: Denied. Visual/auditory hallucinations: Denied. However, her daughter alluded to possible auditory hallucinations in the form her mother making comments surrounding a female voice who gives Ms. Allyne Gee permission to go to certain places or wear certain things. Ms. Idler largely denied this account.  Delusional thoughts: Denied. Mental health treatment: Denied. Despite Ms. Allyne Gee' denial, records suggest that she was hospitalized several times in the past due to symptoms of schizophrenia and possible bipolar disorder. Ms. Goda' daughter confirmed a remote history of several psychiatric hospitalizations.   Tobacco: Denied. Alcohol: Ms. Ganim denied current alcohol consumption, as well as a history of problematic alcohol use, abuse, or dependence.  Recreational drugs: Denied. Caffeine: 2 cups of coffee in the morning.  Academic/Vocational History: Highest level of educational attainment: 7 years. Ms. Wordell noted completing the 7th grade prior to leaving school; she stated that she left school in order to get married. She described herself as a good (A/B) student in academic settings prior to leaving. History of developmental delay: Denied. History of grade repetition:  Denied. History of class failures: Denied. Enrollment in special education courses: Denied. History of diagnosed specific learning disability: Denied. History of ADHD: Denied.  Employment: On disability. Previously, she worked as a Associate Professor, as well as in a mill.   Evaluation Results:  Behavioral Observations: Ms. Allyne GeeSanders was accompanied by her daughter, arrived to her appointment on time, and was appropriately dressed and groomed. She was a somewhat limited historian and found it difficult to elaborate surrounding cognitive difficulties. She also offered information surrounding her psychiatric history which conflicted with her medical records. Observed gait and station were within normal limits. Gross motor functioning appeared intact upon informal observation and no abnormal movements (e.g., tremors) were noted during interview. Throughout testing, Ms. Allyne GeeSanders was noted to rock back and forth while seated in her chair. Her affect was generally relaxed and positive, but did range appropriately given the subject being discussed during the clinical interview or the task at hand during testing procedures. Spontaneous speech was fluent and word finding difficulties were not observed during the clinical interview or testing procedures. Sustained attention was appropriate throughout. Thought processes were coherent, organized, and normal in content. Task engagement was adequate and she persisted when challenged. Overall, Ms. Allyne GeeSanders was cooperative with the clinical interview and subsequent testing procedures.   Adequacy of Effort: The validity of neuropsychological testing is limited by the extent to which the individual being tested may be assumed to have exerted adequate effort during testing. Ms. Allyne GeeSanders expressed her intention to perform to the best of her abilities and exhibited adequate task engagement and persistence. Scores across stand-alone and embedded performance validity measures were  variable. However, below expectation scores are believed to be due to cognitive deficits surrounding memory, rather than attempts to perform poorly or poor engagement. As such, the results of the current evaluation are believed to be a valid representation of Ms. Allyne GeeSanders' current cognitive functioning.  Test Results: Ms. Allyne GeeSanders was somewhat disoriented at the time of the current evaluation. She was unable to state her current age (0/2), as well as her phone number (0/3). Additional points were lost for her being unable to state her current location (0/1).  Intellectual abilities based upon educational and vocational attainment were estimated to be in the below average range. Premorbid abilities were estimated to be within the average range based upon a single-word reading test.   Processing speed was variable, ranging from the exceptionally low to above average normative ranges. Basic attention was average. More complex attention (e.g., working memory) was also average. Executive functioning was variable. Response inhibition represented a weakness, while scores across cognitive flexibility, hypothesis testing and problem solving, and nonverbal abstract reasoning were within normal limits.  Assessed receptive language abilities were within normal limits. Likewise, Ms. Derise did not exhibit any difficulties comprehending task instructions and answered all questions asked of her appropriately. Assessed expressive language (e.g., verbal fluency and confrontation naming) was variable. Confrontation naming was within normal limits, while phonemic fluency was well below average and semantic fluency was exceptionally low.     Assessed visuospatial/visuoconstructional abilities were within normal limits.    Learning (i.e., encoding) of novel verbal and visual information was exceptionally low to well below average. Spontaneous delayed recall (i.e., retrieval) of previously learned information was  exceptionally low and Ms. Allyne GeeSanders was amnestic across all 3 administered memory measures. Performance across recognition tasks was likewise poor, suggesting minimal evidence for information consolidation.   Results of emotional screening instruments suggested that recent symptoms of generalized anxiety were in the mild range, driven largely by somatic complaints, while symptoms of depression were within normal limits. A screening instrument assessing recent sleep quality suggested the presence of minimal sleep dysfunction.  Tables of Scores:   Note: This summary of test scores accompanies the  interpretive report and should not be considered in isolation without reference to the appropriate sections in the text. Descriptors are based on appropriate normative data and may be adjusted based on clinical judgment. The terms impaired and within normal limits (WNL) are used when a more specific level of functioning cannot be determined.       Effort Testing:   DESCRIPTOR       ACS Word Choice: --- --- Below Expectation  Dot Counting Test: --- --- Within Expectation  CVLT-III Forced Choice Recognition: --- --- Within Expectation  BVMT-R Retention Percentage: --- --- Below Expectation       Orientation:      Raw Score Percentile   NAB Orientation, Form 1 23/29 --- ---       Intellectual Functioning:           Standard Score Percentile   Test of Premorbid Functioning: 96 39 Average       Memory:          Wechsler Memory Scale (WMS-IV):                       Raw Score (Scaled Score) Percentile     Logical Memory I 17/53 (5) 5 Well Below Average    Logical Memory II 0/39 (1) <1 Exceptionally Low    Logical Memory Recognition 14/23 <2 Exceptionally Low       [place] Verbal Learning Test (CVLT-III) Brief Form: Raw Score (Scaled/Standard Score) Percentile     Total Trials 1-4 19/36 (68) 2 Exceptionally Low    Short-Delay Free Recall 2/9 (1) <1 Exceptionally Low    Long-Delay Free Recall  0/9 (1) <1 Exceptionally Low    Long-Delay Cued Recall 0/9 (1) <1 Exceptionally Low      Recognition Hits 5/9 (4) 2 Well Below Average      False Positive Errors 3 (5) 5 Well Below Average       Brief Visuospatial Memory Test (BVMT-R), Form 1: Raw Score (T Score) Percentile     Total Trials 1-3 4/36 (20) <1 Exceptionally Low    Delayed Recall 0/12 (20) <1 Exceptionally Low    Recognition Discrimination Index 4 6-10 Well Below Average      Recognition Hits 4/6 6-10 Well Below Average      False Positive Errors 0 >16 Within Normal Limits        Attention/Executive Function:          Trail Making Test (TMT): Raw Score (T Score) Percentile     Part A 33 secs., 0 errors (61) 86 Above Average    Part B 95 secs., 1 error (57) 75 Above Average        Scaled Score Percentile   WAIS-IV Coding: 7 16 Below Average       NAB Attention Module, Form 1: T Score Percentile     Digits Forward 47 38 Average    Digits Backwards 53 62 Average       D-KEFS Color-Word Interference Test: Raw Score (Scaled Score) Percentile     Color Naming 50 secs. (3) 1 Exceptionally Low    Word Reading 31 secs. (7) 16 Below Average    Inhibition 180 secs. (1) <1 Exceptionally Low      Total Errors 4 errors (8) 25 Average    Inhibition/Switching Discontinued --- ---      Total Errors --- --- ---       D-KEFS 20 Questions Test: Scaled Score Percentile  Total Weighted Achievement Score 9 37 Average    Initial Abstraction Score 14 91 Above Average       Language:          Verbal Fluency Test: Raw Score (T Score) Percentile     Phonemic Fluency (FAS) 17 (32) 4 Well Below Average    Animal Fluency 6 (22) <1 Exceptionally Low       NAB Language Module, Form 2: T Score Percentile     Auditory Comprehension 57 75 Above Average    Naming 30/31 (57) 75 Above Average       Visuospatial/Visuoconstruction:      Raw Score Percentile   Clock Drawing: 9/10 --- Within Normal Limits       NAB Spatial Module, Form 2:  T Score Percentile     Figure Drawing Copy 49 46 Average        Scaled Score Percentile   WAIS-IV Matrix Reasoning: 7 16 Below Average       Mood and Personality:      Raw Score Percentile   Geriatric Depression Scale: 7 --- Within Normal Limits  Geriatric Anxiety Scale: 13 --- Mild    Somatic 10 --- Moderate    Cognitive 2 --- Minimal    Affective 1 --- Minimal       Additional Questionnaires:      Raw Score Percentile   PROMIS Sleep Disturbance Questionnaire: 16 --- None to Slight   Informed Consent and Coding/Compliance:   Ms. Gionfriddo was provided with a verbal description of the nature and purpose of the present neuropsychological evaluation. Also reviewed were the foreseeable risks and/or discomforts and benefits of the procedure, limits of confidentiality, and mandatory reporting requirements of this provider. The patient was given the opportunity to ask questions and receive answers about the evaluation. Oral consent to participate was provided by the patient.   This evaluation was conducted by Newman Nickels, Ph.D., licensed clinical neuropsychologist. Ms. Levey completed a 30-minute clinical interview, billed as one unit [phone], and 115 minutes of cognitive testing, billed as one unit [phone] and three additional units 96139. Psychometrist Shan Levans, B.S., assisted Dr. Milbert Coulter with test administration and scoring procedures. As a separate and discrete service, Dr. Milbert Coulter spent a total of 180 minutes in interpretation and report writing, billed as one unit 96132 and two units 96133.

## 2019-09-07 ENCOUNTER — Encounter: Payer: Medicare Other | Admitting: Psychology

## 2019-09-14 ENCOUNTER — Ambulatory Visit (INDEPENDENT_AMBULATORY_CARE_PROVIDER_SITE_OTHER): Payer: Medicare Other | Admitting: Psychology

## 2019-09-14 ENCOUNTER — Other Ambulatory Visit: Payer: Self-pay

## 2019-09-14 ENCOUNTER — Encounter: Payer: Self-pay | Admitting: Psychology

## 2019-09-14 DIAGNOSIS — G3184 Mild cognitive impairment, so stated: Secondary | ICD-10-CM

## 2019-09-14 NOTE — Progress Notes (Signed)
   Neuropsychology Feedback Session Jody Livingston. [facility] Department of Neurology  Reason for Referral:   Jody Livingston a 68 y.o. Caucasian female referred by Ellouise Newer, M.D.,to characterize hercurrent cognitive functioning and assist with diagnostic clarity and treatment planning in the context of subjective cognitive decline and a history of several psychiatric comorbidities.  Feedback:   [name] completed a comprehensive neuropsychological evaluation on 08/31/2019. Please refer to that encounter for the full report. Briefly, results suggested primary impairments across verbal and visual memory, including exhibiting an amnestic profile across all administered memory tests. Additional weaknesses were noted across verbal fluency (semantic worse than phonemic) and response inhibition; variability was exhibited across processing speed. Regarding etiology, [name]' amnestic memory profile, coupled with low scores across a semantic fluency task, are certainly concerning for Alzheimer's disease. However, intact performance across confrontation naming and visuospatial abilities is inconsistent with the typical presentation of this disease and could suggest that she is at the early stages presently.  [name] was accompanied by her daughter during her virtual feedback session. Content of the current session focused on current results. [name] and her daughter were given the opportunity to ask questions; however, they did not have any at the current time. They were also encouraged to reach out should additional questions arise.     A total of 15 minutes were spent with [name] during the current feedback session.

## 2019-09-25 ENCOUNTER — Encounter: Payer: Medicare Other | Admitting: Psychology

## 2020-01-04 DIAGNOSIS — R413 Other amnesia: Secondary | ICD-10-CM | POA: Diagnosis not present

## 2020-01-04 DIAGNOSIS — F349 Persistent mood [affective] disorder, unspecified: Secondary | ICD-10-CM | POA: Diagnosis not present

## 2020-01-04 DIAGNOSIS — Z602 Problems related to living alone: Secondary | ICD-10-CM | POA: Diagnosis not present

## 2020-01-04 DIAGNOSIS — E119 Type 2 diabetes mellitus without complications: Secondary | ICD-10-CM | POA: Diagnosis not present

## 2020-01-04 DIAGNOSIS — I10 Essential (primary) hypertension: Secondary | ICD-10-CM | POA: Diagnosis not present

## 2020-02-08 DIAGNOSIS — I10 Essential (primary) hypertension: Secondary | ICD-10-CM | POA: Diagnosis not present

## 2020-02-08 DIAGNOSIS — Z602 Problems related to living alone: Secondary | ICD-10-CM | POA: Diagnosis not present

## 2020-02-08 DIAGNOSIS — R413 Other amnesia: Secondary | ICD-10-CM | POA: Diagnosis not present

## 2020-02-08 DIAGNOSIS — E119 Type 2 diabetes mellitus without complications: Secondary | ICD-10-CM | POA: Diagnosis not present

## 2020-02-08 DIAGNOSIS — F349 Persistent mood [affective] disorder, unspecified: Secondary | ICD-10-CM | POA: Diagnosis not present

## 2020-02-23 ENCOUNTER — Other Ambulatory Visit: Payer: Self-pay

## 2020-02-23 ENCOUNTER — Telehealth (INDEPENDENT_AMBULATORY_CARE_PROVIDER_SITE_OTHER): Payer: Medicare HMO | Admitting: Neurology

## 2020-02-23 ENCOUNTER — Encounter: Payer: Self-pay | Admitting: Neurology

## 2020-02-23 VITALS — Ht 60.0 in | Wt 161.0 lb

## 2020-02-23 DIAGNOSIS — G3184 Mild cognitive impairment, so stated: Secondary | ICD-10-CM | POA: Diagnosis not present

## 2020-02-23 DIAGNOSIS — F209 Schizophrenia, unspecified: Secondary | ICD-10-CM | POA: Diagnosis not present

## 2020-02-23 NOTE — Progress Notes (Signed)
Virtual Visit via Video Note The purpose of this virtual visit is to provide medical care while limiting exposure to the novel coronavirus.    Consent was obtained for video visit:  Yes.   Answered questions that patient had about telehealth interaction:  Yes.   I discussed the limitations, risks, security and privacy concerns of performing an evaluation and management service by telemedicine. I also discussed with the patient that there may be a patient responsible charge related to this service. The patient expressed understanding and agreed to proceed.  Pt location: Home Physician Location: office Name of referring provider:  Mikael Spray, NP I connected with Alexia Freestone at patients initiation/request on 02/23/2020 at  3:00 PM EDT by video enabled telemedicine application and verified that I am speaking with the correct person using two identifiers. Pt MRN:  [id number] Pt DOB:  Sep 13, 1951 Video Participants:  Alexia Freestone;  Williemae Area (daughter)   History of Present Illness:  The patient was seen as a virtual video visit on 02/23/2020. She was last seen 7 months ago for memory loss. She is again accompanied by her daughter [name] who helps supplement the history today. Since her last visit, she underwent Neuropsychological testing in November 2020 with a diagnosis of Mild Neurocognitive Disorder concerning for Alzheimer's disease. She had primary impairments across verbal and visual memory, including exhibiting an amnestic profile across all administered memory tests. She also had weakness across verbal fluency and response inhibition. She performed well with other tasks suggesting that she is at the early stages. It was also noted that a history of schizophrenia has been shown to be associated with cognitive deficits, and mild psychiatric distress (mild to moderate anxiety) can also influence cognitive efficiency.   She states she forgets her medications sometimes.  [name] comes once a week but does not check medications. Her son comes when she needs to pay her bills, he takes her to pay them. Her other daughter took care of her [employer] bill, Ms Zuch said she did not forget and that [name] said she would pay for it. She does not drive. She does not cook. She denies any headaches, dizziness, vision changes, no falls. She is tolerating Donepezil 10mg  daily without side effects. She will be switching to a different psychiatrist this month ([facility]).   History on Initial Assessment 12/17/2018: This is a 69 year old left-handed woman with a history of hypertension, hyperlipidemia, diabetes, CAD, schizophrenia, presenting for evaluation of epilepsy and depakote level <4. The patient and her daughter 71 deny any prior history of epilepsy or seizures. They both deny any prior history of convulsions, staring/unresponsive episodes, or any seizure-like symptoms. There are no prior records for review. She states the Depakote is being prescribed by her psychiatrist Dr. [name]. She states that "I was complaining about memory loss." Bayard Males does not provide much information and states she does not know much because the patient lives with her other daughter, [name]. [name] tried to get in contact with [name] to provide additional information, and [name] sent a message: "she sees Dr. [name] for schizophrenia, dementia, COPD, tachycardia, hypertension, diabetes. Why is she not remembering things correctly when supposedly taking all her medication?" The patient states that Bayard Males lives with her, that the patient pays the rent and utilities and denies missing bills. She stopped driving 2 years ago, it is unclear what exactly occurred, but she denies getting lost and states family would not bring her. [name] reports she stopped working 25  years ago, she was driving and did not know where she was going, she would go to work and keep driving, not knowing where she  would end up. [name] reports she forgets to eat. She states she sometimes forgets her medications, and that [name] does not help her. She puts her medications in a pill caddy and states that her sone comes and "moves them every which way, not in the right places." She states that "he says I don't know what I am doing" and insisted to take her to a psychiatrist. Each time she goes to Dr. Bayard Males and complains about side effects of Depakote, "he asks me to increase it, they are trying to prescribe 4 but I am only taking 1." She states she only started seeing a psychiatrist 2 years ago and that prior to this she had never seen one. Her daughter reminds her that she kept going to the inpatient [facility]iatric hospital in [place] 10 years ago and was discharged home on Risperdal.   She denies any headaches, dizziness, diplopia, dysarthria/dysphagia, neck/back pain, focal numbness/tingling/weakness, bowel/bladder dysfunction, tremors. She has noticed a decreased sense of smell. She has had bedwetting since childhood. She denies any olfactory/gustatory hallucinations, rising epigastric sensation, myoclonic jerks. No family history of dementia or seizures. No history of significant head injuries, alcohol intake. No history of febrile convulsions, CNS infections, neurosurgical procedures.      Current Outpatient Medications on File Prior to Visit  Medication Sig Dispense Refill  . clopidogrel (PLAVIX) 75 MG tablet Take 75 mg by mouth daily.    . COMBIVENT RESPIMAT 20-100 MCG/ACT AERS respimat INHALE 1 PUFF BY MOUTH 4 TIMES DAILY    . D3-1000 25 MCG (1000 UT) capsule Take 1,000 Units by mouth daily.    Marland Kitchen diltiazem (CARDIZEM CD) 180 MG 24 hr capsule Take 180 mg by mouth daily.    . divalproex (DEPAKOTE ER) 250 MG 24 hr tablet Take 750 mg by mouth at bedtime.     . donepezil (ARICEPT) 10 MG tablet Take 1 tablet (10 mg total) by mouth at bedtime. Take 1 tablet daily 90 tablet 3  . glimepiride (AMARYL) 4 MG tablet  TAKE ONE TABLET BY MOUTH EVERY MORNING FOR DIABETES    . metFORMIN (GLUCOPHAGE) 500 MG tablet Take 500 mg by mouth daily.    . montelukast (SINGULAIR) 10 MG tablet Take 10 mg by mouth daily.    . risperiDONE (RISPERDAL) 0.5 MG tablet Take 0.5 mg by mouth at bedtime.    . risperidone (RISPERDAL) 4 MG tablet Take 4 mg by mouth 2 (two) times daily.    . rosuvastatin (CRESTOR) 10 MG tablet Take 10 mg by mouth daily.     No current facility-administered medications on file prior to visit.      Observations/Objective:   Vitals:   02/23/20 0933  Weight: 161 lb (73 kg)  Height: 5' (1.524 m)   GEN:  The patient appears stated age and is in NAD.  Neurological examination: Patient is awake, alert, oriented x 3. No aphasia or dysarthria. Intact fluency and comprehension. Remote and recent memory impaired. Cranial nerves: Extraocular movements intact with no nystagmus. No facial asymmetry. Motor: moves all extremities symmetrically, at least anti-gravity x 4.    Assessment and Plan:   This is a 69 yo LH woman with a history of hypertension, hyperlipidemia, diabetes, CAD, schizophrenia, who initially presented for evaluation of epilepsy and depakote level <4, but on further questioning, she has no history of epilepsy or  seizures and takes Depakote for mood stabilization and that the main concern was for memory loss. She underwent Neuropsychological testing with a diagnosis of Mild Neurocognitive Disorder, possibly due to Alzheimer's disease, but in early stages. She is on Donepezil 10mg  daily without side effects. Her daughter was instructed to start checking behind her to ensure medication compliance. She does not drive. Continue close supervision. Follow-up with Psychiatry as scheduled. Follow-up in 6 months, they know to call for any changes.    Follow Up Instructions:   -I discussed the assessment and treatment plan with the patient. The patient was provided an opportunity to ask questions and all  were answered. The patient agreed with the plan and demonstrated an understanding of the instructions.   The patient was advised to call back or seek an in-person evaluation if the symptoms worsen or if the condition fails to improve as anticipated.   Cameron Sprang, MD

## 2020-03-08 DIAGNOSIS — R413 Other amnesia: Secondary | ICD-10-CM | POA: Diagnosis not present

## 2020-03-08 DIAGNOSIS — Z602 Problems related to living alone: Secondary | ICD-10-CM | POA: Diagnosis not present

## 2020-03-08 DIAGNOSIS — I10 Essential (primary) hypertension: Secondary | ICD-10-CM | POA: Diagnosis not present

## 2020-03-08 DIAGNOSIS — E119 Type 2 diabetes mellitus without complications: Secondary | ICD-10-CM | POA: Diagnosis not present

## 2020-03-08 DIAGNOSIS — F349 Persistent mood [affective] disorder, unspecified: Secondary | ICD-10-CM | POA: Diagnosis not present

## 2020-03-10 DIAGNOSIS — F209 Schizophrenia, unspecified: Secondary | ICD-10-CM | POA: Diagnosis not present

## 2020-04-12 DIAGNOSIS — I10 Essential (primary) hypertension: Secondary | ICD-10-CM | POA: Diagnosis not present

## 2020-04-12 DIAGNOSIS — E559 Vitamin D deficiency, unspecified: Secondary | ICD-10-CM | POA: Diagnosis not present

## 2020-04-12 DIAGNOSIS — E119 Type 2 diabetes mellitus without complications: Secondary | ICD-10-CM | POA: Diagnosis not present

## 2020-04-12 DIAGNOSIS — Z602 Problems related to living alone: Secondary | ICD-10-CM | POA: Diagnosis not present

## 2020-04-12 DIAGNOSIS — F349 Persistent mood [affective] disorder, unspecified: Secondary | ICD-10-CM | POA: Diagnosis not present

## 2020-04-12 DIAGNOSIS — R413 Other amnesia: Secondary | ICD-10-CM | POA: Diagnosis not present

## 2020-04-12 DIAGNOSIS — Z712 Person consulting for explanation of examination or test findings: Secondary | ICD-10-CM | POA: Diagnosis not present

## 2020-05-16 DIAGNOSIS — R413 Other amnesia: Secondary | ICD-10-CM | POA: Diagnosis not present

## 2020-05-16 DIAGNOSIS — I10 Essential (primary) hypertension: Secondary | ICD-10-CM | POA: Diagnosis not present

## 2020-05-16 DIAGNOSIS — F349 Persistent mood [affective] disorder, unspecified: Secondary | ICD-10-CM | POA: Diagnosis not present

## 2020-05-16 DIAGNOSIS — E119 Type 2 diabetes mellitus without complications: Secondary | ICD-10-CM | POA: Diagnosis not present

## 2020-05-16 DIAGNOSIS — J449 Chronic obstructive pulmonary disease, unspecified: Secondary | ICD-10-CM | POA: Diagnosis not present

## 2020-06-20 DIAGNOSIS — R413 Other amnesia: Secondary | ICD-10-CM | POA: Diagnosis not present

## 2020-06-20 DIAGNOSIS — E119 Type 2 diabetes mellitus without complications: Secondary | ICD-10-CM | POA: Diagnosis not present

## 2020-06-20 DIAGNOSIS — R05 Cough: Secondary | ICD-10-CM | POA: Diagnosis not present

## 2020-06-20 DIAGNOSIS — R110 Nausea: Secondary | ICD-10-CM | POA: Diagnosis not present

## 2020-06-20 DIAGNOSIS — F349 Persistent mood [affective] disorder, unspecified: Secondary | ICD-10-CM | POA: Diagnosis not present

## 2020-06-20 DIAGNOSIS — Z03818 Encounter for observation for suspected exposure to other biological agents ruled out: Secondary | ICD-10-CM | POA: Diagnosis not present

## 2020-06-20 DIAGNOSIS — R0981 Nasal congestion: Secondary | ICD-10-CM | POA: Diagnosis not present

## 2020-06-20 DIAGNOSIS — I10 Essential (primary) hypertension: Secondary | ICD-10-CM | POA: Diagnosis not present

## 2020-07-18 DIAGNOSIS — I10 Essential (primary) hypertension: Secondary | ICD-10-CM | POA: Diagnosis not present

## 2020-07-18 DIAGNOSIS — R110 Nausea: Secondary | ICD-10-CM | POA: Diagnosis not present

## 2020-07-18 DIAGNOSIS — R413 Other amnesia: Secondary | ICD-10-CM | POA: Diagnosis not present

## 2020-07-18 DIAGNOSIS — R1013 Epigastric pain: Secondary | ICD-10-CM | POA: Diagnosis not present

## 2020-07-18 DIAGNOSIS — E119 Type 2 diabetes mellitus without complications: Secondary | ICD-10-CM | POA: Diagnosis not present

## 2020-08-10 ENCOUNTER — Other Ambulatory Visit: Payer: Self-pay | Admitting: Neurology

## 2020-08-11 DIAGNOSIS — R1013 Epigastric pain: Secondary | ICD-10-CM | POA: Diagnosis not present

## 2020-08-15 DIAGNOSIS — Z5181 Encounter for therapeutic drug level monitoring: Secondary | ICD-10-CM | POA: Diagnosis not present

## 2020-08-15 DIAGNOSIS — I10 Essential (primary) hypertension: Secondary | ICD-10-CM | POA: Diagnosis not present

## 2020-08-15 DIAGNOSIS — E119 Type 2 diabetes mellitus without complications: Secondary | ICD-10-CM | POA: Diagnosis not present

## 2020-08-15 DIAGNOSIS — H6123 Impacted cerumen, bilateral: Secondary | ICD-10-CM | POA: Diagnosis not present

## 2020-08-15 DIAGNOSIS — R413 Other amnesia: Secondary | ICD-10-CM | POA: Diagnosis not present

## 2020-09-12 DIAGNOSIS — R413 Other amnesia: Secondary | ICD-10-CM | POA: Diagnosis not present

## 2020-09-12 DIAGNOSIS — E119 Type 2 diabetes mellitus without complications: Secondary | ICD-10-CM | POA: Diagnosis not present

## 2020-09-12 DIAGNOSIS — Z5181 Encounter for therapeutic drug level monitoring: Secondary | ICD-10-CM | POA: Diagnosis not present

## 2020-09-12 DIAGNOSIS — I10 Essential (primary) hypertension: Secondary | ICD-10-CM | POA: Diagnosis not present

## 2020-09-12 DIAGNOSIS — K3184 Gastroparesis: Secondary | ICD-10-CM | POA: Diagnosis not present

## 2020-10-10 DIAGNOSIS — E119 Type 2 diabetes mellitus without complications: Secondary | ICD-10-CM | POA: Diagnosis not present

## 2020-10-10 DIAGNOSIS — Z09 Encounter for follow-up examination after completed treatment for conditions other than malignant neoplasm: Secondary | ICD-10-CM | POA: Diagnosis not present

## 2020-10-10 DIAGNOSIS — I10 Essential (primary) hypertension: Secondary | ICD-10-CM | POA: Diagnosis not present

## 2020-10-10 DIAGNOSIS — R413 Other amnesia: Secondary | ICD-10-CM | POA: Diagnosis not present

## 2020-10-20 IMAGING — MR MRI HEAD WITHOUT AND WITH CONTRAST
12 series · 48 of 48 positions shown · IV contrast (15ml Multihance)
Comparison: CT head 02/25/2018.  MR head 10/16/2016.

CLINICAL DATA: Years of memory loss, history of schizophrenia.
Evaluate for structural abnormality. Low Depakote level unclear
history of seizures.

EXAM:
MRI HEAD WITHOUT AND WITH CONTRAST
TECHNIQUE: Multiplanar, multiecho pulse sequences of the brain and surrounding
structures were obtained without and with intravenous contrast.
CONTRAST:  15mL MULTIHANCE GADOBENATE DIMEGLUMINE 529 MG/ML IV SOLN
Creatinine was obtained on site at [HOSPITAL] at [HOSPITAL].
Results: Creatinine 0.9 mg/dL.

[Series 2: t1_se_sag · sagittal · 5.0mm · 0.45mm/px · 1 of 21 slices shown]
[im 1/21]
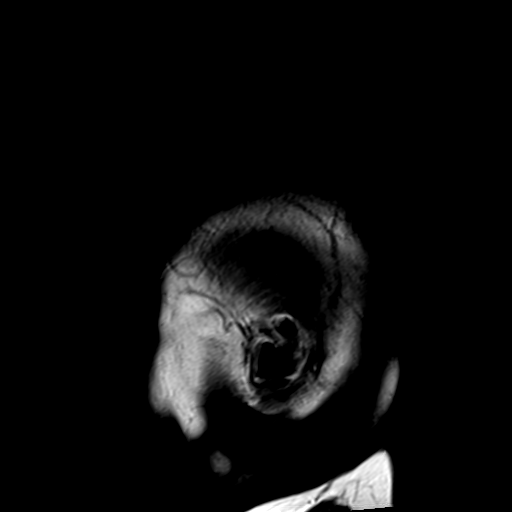

[Series 3: ep2d_diff_(id)_trace · axial · 3.0mm · 1.80mm/px · z∈[-78,+63]mm · 6 of 94 slices shown]
[im 1/94]
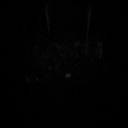
[im 19/94]
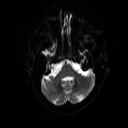
[im 38/94]
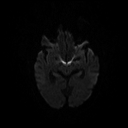
[im 56/94]
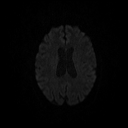
[im 75/94]
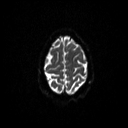
[im 94/94]
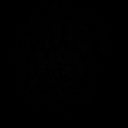

[Series 4: ep2d_diff_(id)_trace_adc · axial · 3.0mm · 1.80mm/px · z∈[-78,+63]mm · 3 of 47 slices shown]
[im 1/47]
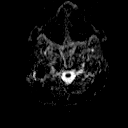
[im 24/47]
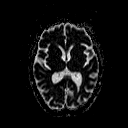
[im 47/47]
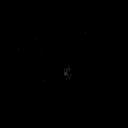

[Series 5: ep2d_diff_cor · coronal · 5.0mm · 1.77mm/px · 3 of 49 slices shown]
[im 1/49]
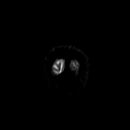
[im 25/49]
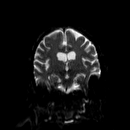
[im 49/49]
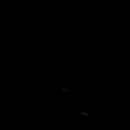

[Series 6: ep2d_diff_cor_adc · coronal · 5.0mm · 1.77mm/px · 2 of 26 slices shown]
[im 1/26]
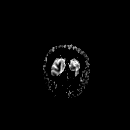
[im 26/26]
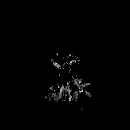

[Series 8: swi_images · axial · 2.0mm · 0.90mm/px · z∈[-77,+64]mm · 5 of 72 slices shown]
[im 1/72]
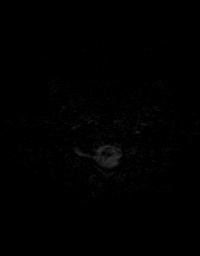
[im 18/72]
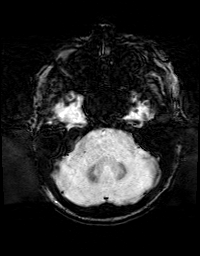
[im 36/72]
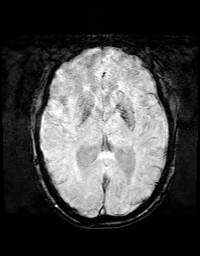
[im 54/72]
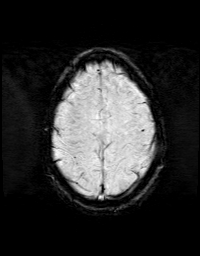
[im 72/72]
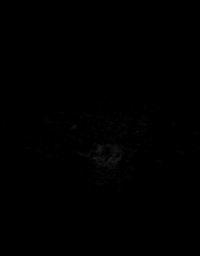

[Series 9: t2_tse_tra_512 · axial · 5.0mm · 0.60mm/px · z∈[-78,+60]mm · 2 of 24 slices shown]
[im 1/24]
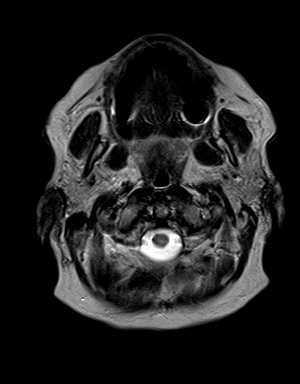
[im 24/24]
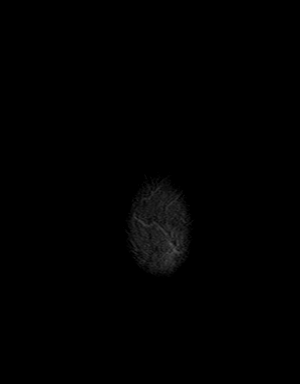

[Series 10: t1_mpr_tra · axial · 1.0mm · 0.72mm/px · z∈[-80,+62]mm · 10 of 144 slices shown]
[im 1/144]
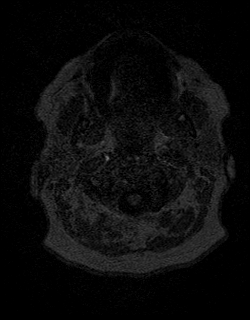
[im 16/144]
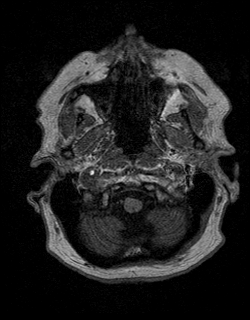
[im 32/144]
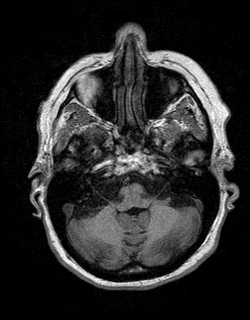
[im 48/144]
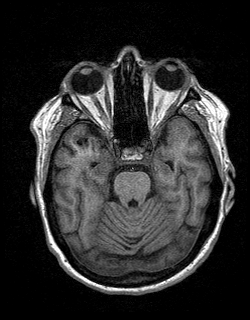
[im 64/144]
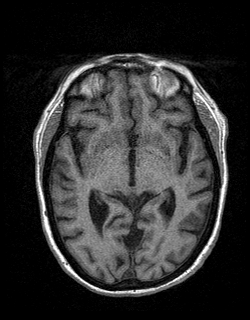
[im 80/144]
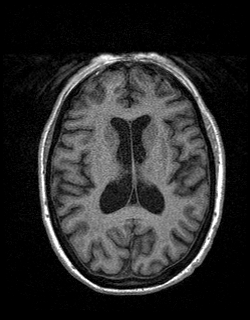
[im 96/144]
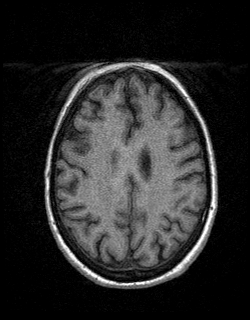
[im 112/144]
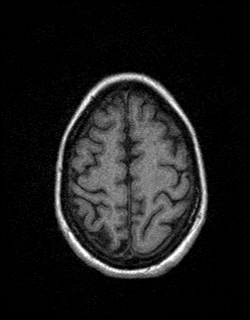
[im 128/144]
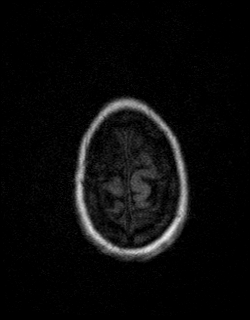
[im 144/144]
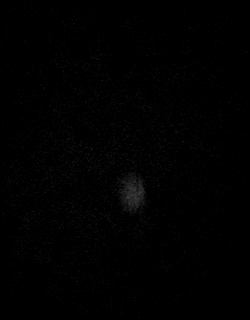

[Series 11: FLAIR · axial · 3.0mm · 0.43mm/px · z∈[-87,+68]mm · 2 of 27 slices shown]
[im 1/27]
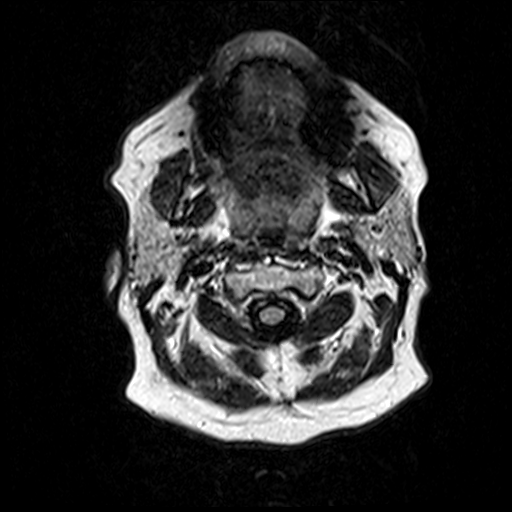
[im 27/27]
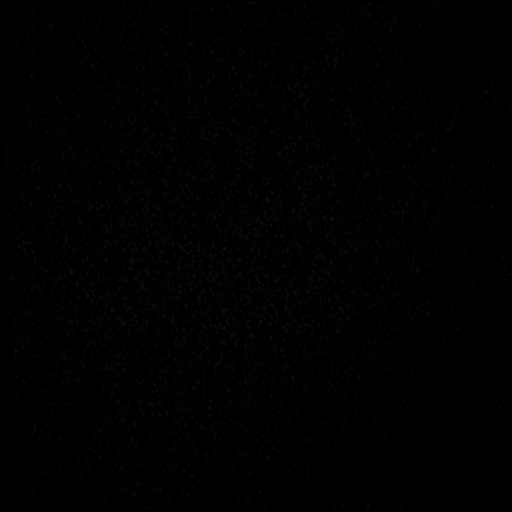

[Series 12: T2 · coronal · 5.0mm · 0.45mm/px · 2 of 26 slices shown]
[im 1/26]
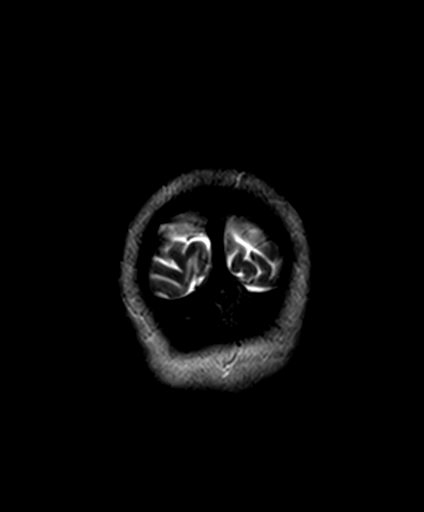
[im 26/26]
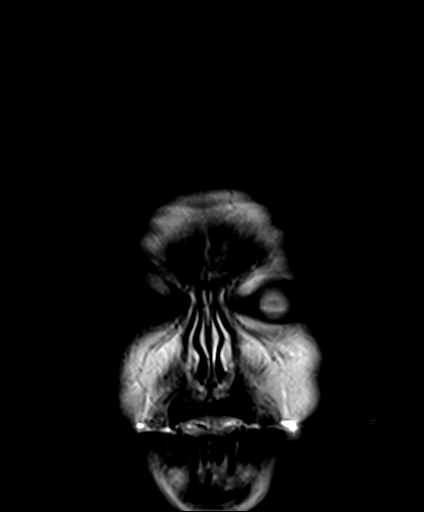

[Series 13: T1 post-contrast · coronal · 5.0mm · 0.72mm/px · 2 of 26 slices shown]
[im 1/26]
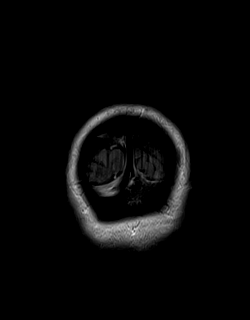
[im 26/26]
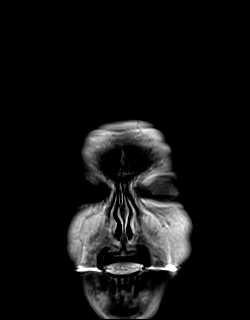

[Series 14: post t1_mpr_tra · axial · 1.0mm · 0.72mm/px · z∈[-80,+62]mm · 10 of 144 slices shown]
[im 1/144]
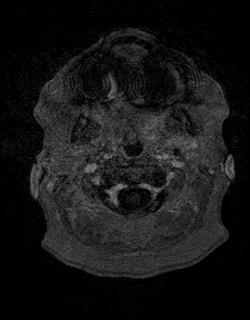
[im 16/144]
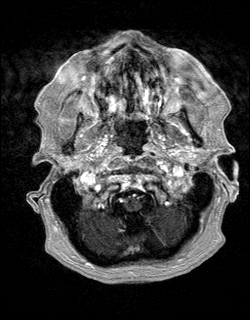
[im 32/144]
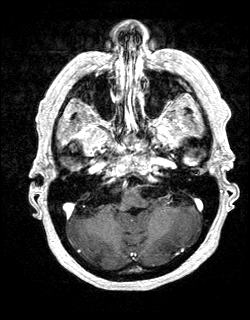
[im 48/144]
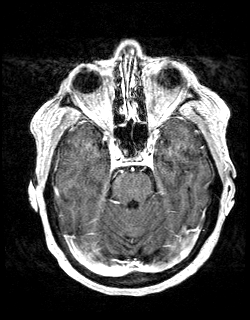
[im 64/144]
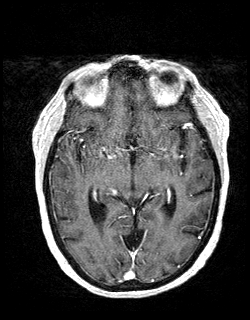
[im 80/144]
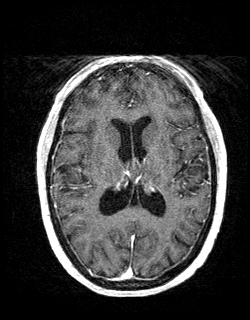
[im 96/144]
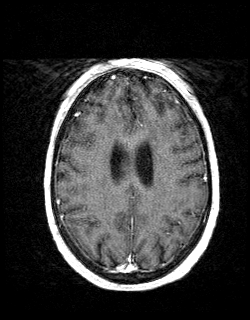
[im 112/144]
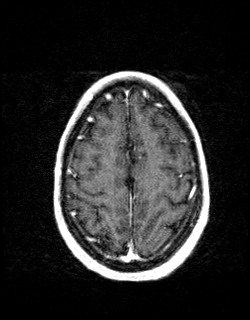
[im 128/144]
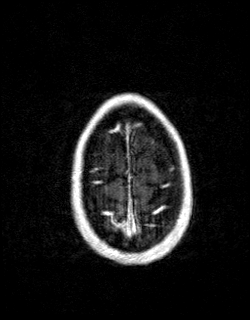
[im 144/144]
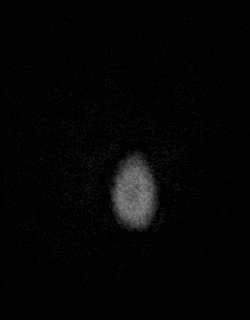

[48 of 48 positions shown; findings below may reference images not displayed]

FINDINGS: The patient was unable to remain motionless for the exam. Small or
subtle lesions could be overlooked.

Brain: No evidence for acute infarction, hemorrhage, mass lesion,
hydrocephalus, or extra-axial fluid. Generalized atrophy.

Mild subcortical and periventricular T2 and FLAIR hyperintensities,
likely chronic microvascular ischemic change. These white matter
changes are greater posteriorly, parieto-occipital predominant,
relatively symmetric and not clearly progressive from previous MR
imaging.

Post infusion, no abnormal enhancement of the brain or meninges.

Vascular: Decreased to absent LEFT vertebral flow void noted
previously, likely related to documented atheromatous LEFT
subclavian disease. The internal carotid arteries and basilar artery
widely patent.

Skull and upper cervical spine: Unremarkable.

Sinuses/Orbits: Unremarkable.

Other: Unremarkable
IMPRESSION: Atrophy and small vessel disease similar to priors.

No significant progression compared with 6690. See discussion above.

## 2020-10-22 DIAGNOSIS — J45909 Unspecified asthma, uncomplicated: Secondary | ICD-10-CM

## 2020-10-22 DIAGNOSIS — Z7982 Long term (current) use of aspirin: Secondary | ICD-10-CM | POA: Insufficient documentation

## 2020-10-22 DIAGNOSIS — E039 Hypothyroidism, unspecified: Secondary | ICD-10-CM | POA: Insufficient documentation

## 2020-10-22 DIAGNOSIS — Z7984 Long term (current) use of oral hypoglycemic drugs: Secondary | ICD-10-CM

## 2020-10-22 DIAGNOSIS — Z87891 Personal history of nicotine dependence: Secondary | ICD-10-CM | POA: Insufficient documentation

## 2020-10-22 DIAGNOSIS — L602 Onychogryphosis: Secondary | ICD-10-CM

## 2020-10-22 HISTORY — DX: Onychogryphosis: L60.2

## 2020-10-22 HISTORY — DX: Hypothyroidism, unspecified: E03.9

## 2020-10-22 HISTORY — DX: Unspecified asthma, uncomplicated: J45.909

## 2020-10-22 HISTORY — DX: Personal history of nicotine dependence: Z87.891

## 2020-10-22 HISTORY — DX: Long term (current) use of oral hypoglycemic drugs: Z79.84

## 2020-10-22 HISTORY — DX: Long term (current) use of aspirin: Z79.82

## 2020-11-14 DIAGNOSIS — Z0001 Encounter for general adult medical examination with abnormal findings: Secondary | ICD-10-CM | POA: Diagnosis not present

## 2020-11-14 DIAGNOSIS — R413 Other amnesia: Secondary | ICD-10-CM | POA: Diagnosis not present

## 2020-12-12 DIAGNOSIS — E119 Type 2 diabetes mellitus without complications: Secondary | ICD-10-CM | POA: Diagnosis not present

## 2020-12-12 DIAGNOSIS — R413 Other amnesia: Secondary | ICD-10-CM | POA: Diagnosis not present

## 2020-12-12 DIAGNOSIS — L602 Onychogryphosis: Secondary | ICD-10-CM | POA: Diagnosis not present

## 2020-12-12 DIAGNOSIS — E782 Mixed hyperlipidemia: Secondary | ICD-10-CM | POA: Diagnosis not present

## 2020-12-12 DIAGNOSIS — Z130 Encounter for screening for diseases of the blood and blood-forming organs and certain disorders involving the immune mechanism: Secondary | ICD-10-CM | POA: Diagnosis not present

## 2020-12-12 DIAGNOSIS — I10 Essential (primary) hypertension: Secondary | ICD-10-CM | POA: Diagnosis not present

## 2020-12-21 DIAGNOSIS — E1165 Type 2 diabetes mellitus with hyperglycemia: Secondary | ICD-10-CM | POA: Diagnosis not present

## 2020-12-21 DIAGNOSIS — F0390 Unspecified dementia without behavioral disturbance: Secondary | ICD-10-CM | POA: Diagnosis not present

## 2020-12-21 DIAGNOSIS — E785 Hyperlipidemia, unspecified: Secondary | ICD-10-CM | POA: Diagnosis not present

## 2020-12-21 DIAGNOSIS — L602 Onychogryphosis: Secondary | ICD-10-CM | POA: Diagnosis not present

## 2020-12-21 DIAGNOSIS — F209 Schizophrenia, unspecified: Secondary | ICD-10-CM | POA: Diagnosis not present

## 2020-12-21 DIAGNOSIS — J45909 Unspecified asthma, uncomplicated: Secondary | ICD-10-CM | POA: Diagnosis not present

## 2020-12-21 DIAGNOSIS — E039 Hypothyroidism, unspecified: Secondary | ICD-10-CM | POA: Diagnosis not present

## 2020-12-21 DIAGNOSIS — I10 Essential (primary) hypertension: Secondary | ICD-10-CM | POA: Diagnosis not present

## 2020-12-21 DIAGNOSIS — J449 Chronic obstructive pulmonary disease, unspecified: Secondary | ICD-10-CM | POA: Diagnosis not present

## 2020-12-26 DIAGNOSIS — E119 Type 2 diabetes mellitus without complications: Secondary | ICD-10-CM | POA: Diagnosis not present

## 2020-12-26 DIAGNOSIS — J449 Chronic obstructive pulmonary disease, unspecified: Secondary | ICD-10-CM | POA: Diagnosis not present

## 2020-12-26 DIAGNOSIS — L602 Onychogryphosis: Secondary | ICD-10-CM | POA: Diagnosis not present

## 2020-12-26 DIAGNOSIS — L603 Nail dystrophy: Secondary | ICD-10-CM | POA: Diagnosis not present

## 2020-12-27 DIAGNOSIS — J449 Chronic obstructive pulmonary disease, unspecified: Secondary | ICD-10-CM | POA: Diagnosis not present

## 2020-12-27 DIAGNOSIS — F209 Schizophrenia, unspecified: Secondary | ICD-10-CM | POA: Diagnosis not present

## 2020-12-27 DIAGNOSIS — F0390 Unspecified dementia without behavioral disturbance: Secondary | ICD-10-CM | POA: Diagnosis not present

## 2020-12-27 DIAGNOSIS — E1165 Type 2 diabetes mellitus with hyperglycemia: Secondary | ICD-10-CM | POA: Diagnosis not present

## 2020-12-27 DIAGNOSIS — E785 Hyperlipidemia, unspecified: Secondary | ICD-10-CM | POA: Diagnosis not present

## 2020-12-27 DIAGNOSIS — J45909 Unspecified asthma, uncomplicated: Secondary | ICD-10-CM | POA: Diagnosis not present

## 2020-12-27 DIAGNOSIS — L602 Onychogryphosis: Secondary | ICD-10-CM | POA: Diagnosis not present

## 2020-12-27 DIAGNOSIS — I10 Essential (primary) hypertension: Secondary | ICD-10-CM | POA: Diagnosis not present

## 2020-12-27 DIAGNOSIS — E039 Hypothyroidism, unspecified: Secondary | ICD-10-CM | POA: Diagnosis not present

## 2021-01-03 DIAGNOSIS — I10 Essential (primary) hypertension: Secondary | ICD-10-CM | POA: Diagnosis not present

## 2021-01-03 DIAGNOSIS — E039 Hypothyroidism, unspecified: Secondary | ICD-10-CM | POA: Diagnosis not present

## 2021-01-03 DIAGNOSIS — J45909 Unspecified asthma, uncomplicated: Secondary | ICD-10-CM | POA: Diagnosis not present

## 2021-01-03 DIAGNOSIS — F0390 Unspecified dementia without behavioral disturbance: Secondary | ICD-10-CM | POA: Diagnosis not present

## 2021-01-03 DIAGNOSIS — L602 Onychogryphosis: Secondary | ICD-10-CM | POA: Diagnosis not present

## 2021-01-03 DIAGNOSIS — J449 Chronic obstructive pulmonary disease, unspecified: Secondary | ICD-10-CM | POA: Diagnosis not present

## 2021-01-03 DIAGNOSIS — E785 Hyperlipidemia, unspecified: Secondary | ICD-10-CM | POA: Diagnosis not present

## 2021-01-03 DIAGNOSIS — E1165 Type 2 diabetes mellitus with hyperglycemia: Secondary | ICD-10-CM | POA: Diagnosis not present

## 2021-01-03 DIAGNOSIS — F209 Schizophrenia, unspecified: Secondary | ICD-10-CM | POA: Diagnosis not present

## 2021-01-05 DIAGNOSIS — F0390 Unspecified dementia without behavioral disturbance: Secondary | ICD-10-CM | POA: Diagnosis not present

## 2021-01-05 DIAGNOSIS — I10 Essential (primary) hypertension: Secondary | ICD-10-CM | POA: Diagnosis not present

## 2021-01-05 DIAGNOSIS — E039 Hypothyroidism, unspecified: Secondary | ICD-10-CM | POA: Diagnosis not present

## 2021-01-05 DIAGNOSIS — J449 Chronic obstructive pulmonary disease, unspecified: Secondary | ICD-10-CM | POA: Diagnosis not present

## 2021-01-05 DIAGNOSIS — E1165 Type 2 diabetes mellitus with hyperglycemia: Secondary | ICD-10-CM | POA: Diagnosis not present

## 2021-01-05 DIAGNOSIS — E785 Hyperlipidemia, unspecified: Secondary | ICD-10-CM | POA: Diagnosis not present

## 2021-01-05 DIAGNOSIS — L602 Onychogryphosis: Secondary | ICD-10-CM | POA: Diagnosis not present

## 2021-01-05 DIAGNOSIS — F209 Schizophrenia, unspecified: Secondary | ICD-10-CM | POA: Diagnosis not present

## 2021-01-05 DIAGNOSIS — J45909 Unspecified asthma, uncomplicated: Secondary | ICD-10-CM | POA: Diagnosis not present

## 2021-01-11 DIAGNOSIS — L602 Onychogryphosis: Secondary | ICD-10-CM | POA: Diagnosis not present

## 2021-01-11 DIAGNOSIS — J449 Chronic obstructive pulmonary disease, unspecified: Secondary | ICD-10-CM | POA: Diagnosis not present

## 2021-01-11 DIAGNOSIS — J45909 Unspecified asthma, uncomplicated: Secondary | ICD-10-CM | POA: Diagnosis not present

## 2021-01-11 DIAGNOSIS — E039 Hypothyroidism, unspecified: Secondary | ICD-10-CM | POA: Diagnosis not present

## 2021-01-11 DIAGNOSIS — F0390 Unspecified dementia without behavioral disturbance: Secondary | ICD-10-CM | POA: Diagnosis not present

## 2021-01-11 DIAGNOSIS — I10 Essential (primary) hypertension: Secondary | ICD-10-CM | POA: Diagnosis not present

## 2021-01-11 DIAGNOSIS — E1165 Type 2 diabetes mellitus with hyperglycemia: Secondary | ICD-10-CM | POA: Diagnosis not present

## 2021-01-11 DIAGNOSIS — E785 Hyperlipidemia, unspecified: Secondary | ICD-10-CM | POA: Diagnosis not present

## 2021-01-11 DIAGNOSIS — F209 Schizophrenia, unspecified: Secondary | ICD-10-CM | POA: Diagnosis not present

## 2021-01-13 DIAGNOSIS — E785 Hyperlipidemia, unspecified: Secondary | ICD-10-CM | POA: Diagnosis not present

## 2021-01-13 DIAGNOSIS — E039 Hypothyroidism, unspecified: Secondary | ICD-10-CM | POA: Diagnosis not present

## 2021-01-13 DIAGNOSIS — L602 Onychogryphosis: Secondary | ICD-10-CM | POA: Diagnosis not present

## 2021-01-13 DIAGNOSIS — F0390 Unspecified dementia without behavioral disturbance: Secondary | ICD-10-CM | POA: Diagnosis not present

## 2021-01-13 DIAGNOSIS — F209 Schizophrenia, unspecified: Secondary | ICD-10-CM | POA: Diagnosis not present

## 2021-01-13 DIAGNOSIS — J45909 Unspecified asthma, uncomplicated: Secondary | ICD-10-CM | POA: Diagnosis not present

## 2021-01-13 DIAGNOSIS — I10 Essential (primary) hypertension: Secondary | ICD-10-CM | POA: Diagnosis not present

## 2021-01-13 DIAGNOSIS — J449 Chronic obstructive pulmonary disease, unspecified: Secondary | ICD-10-CM | POA: Diagnosis not present

## 2021-01-13 DIAGNOSIS — E1165 Type 2 diabetes mellitus with hyperglycemia: Secondary | ICD-10-CM | POA: Diagnosis not present

## 2021-01-16 DIAGNOSIS — E785 Hyperlipidemia, unspecified: Secondary | ICD-10-CM | POA: Diagnosis not present

## 2021-01-16 DIAGNOSIS — I10 Essential (primary) hypertension: Secondary | ICD-10-CM | POA: Diagnosis not present

## 2021-01-16 DIAGNOSIS — F209 Schizophrenia, unspecified: Secondary | ICD-10-CM | POA: Diagnosis not present

## 2021-01-16 DIAGNOSIS — E119 Type 2 diabetes mellitus without complications: Secondary | ICD-10-CM | POA: Diagnosis not present

## 2021-01-16 DIAGNOSIS — J449 Chronic obstructive pulmonary disease, unspecified: Secondary | ICD-10-CM | POA: Diagnosis not present

## 2021-01-16 DIAGNOSIS — E039 Hypothyroidism, unspecified: Secondary | ICD-10-CM | POA: Diagnosis not present

## 2021-01-16 DIAGNOSIS — J45909 Unspecified asthma, uncomplicated: Secondary | ICD-10-CM | POA: Diagnosis not present

## 2021-01-16 DIAGNOSIS — Z130 Encounter for screening for diseases of the blood and blood-forming organs and certain disorders involving the immune mechanism: Secondary | ICD-10-CM | POA: Diagnosis not present

## 2021-01-16 DIAGNOSIS — F0390 Unspecified dementia without behavioral disturbance: Secondary | ICD-10-CM | POA: Diagnosis not present

## 2021-01-16 DIAGNOSIS — L602 Onychogryphosis: Secondary | ICD-10-CM | POA: Diagnosis not present

## 2021-01-16 DIAGNOSIS — R413 Other amnesia: Secondary | ICD-10-CM | POA: Diagnosis not present

## 2021-01-16 DIAGNOSIS — E1165 Type 2 diabetes mellitus with hyperglycemia: Secondary | ICD-10-CM | POA: Diagnosis not present

## 2021-01-19 DIAGNOSIS — E785 Hyperlipidemia, unspecified: Secondary | ICD-10-CM | POA: Diagnosis not present

## 2021-01-19 DIAGNOSIS — E1165 Type 2 diabetes mellitus with hyperglycemia: Secondary | ICD-10-CM | POA: Diagnosis not present

## 2021-01-19 DIAGNOSIS — E039 Hypothyroidism, unspecified: Secondary | ICD-10-CM | POA: Diagnosis not present

## 2021-01-19 DIAGNOSIS — J45909 Unspecified asthma, uncomplicated: Secondary | ICD-10-CM | POA: Diagnosis not present

## 2021-01-19 DIAGNOSIS — J449 Chronic obstructive pulmonary disease, unspecified: Secondary | ICD-10-CM | POA: Diagnosis not present

## 2021-01-19 DIAGNOSIS — L602 Onychogryphosis: Secondary | ICD-10-CM | POA: Diagnosis not present

## 2021-01-19 DIAGNOSIS — I10 Essential (primary) hypertension: Secondary | ICD-10-CM | POA: Diagnosis not present

## 2021-01-19 DIAGNOSIS — F209 Schizophrenia, unspecified: Secondary | ICD-10-CM | POA: Diagnosis not present

## 2021-01-19 DIAGNOSIS — F0390 Unspecified dementia without behavioral disturbance: Secondary | ICD-10-CM | POA: Diagnosis not present

## 2021-01-24 DIAGNOSIS — F0390 Unspecified dementia without behavioral disturbance: Secondary | ICD-10-CM | POA: Diagnosis not present

## 2021-01-24 DIAGNOSIS — E1165 Type 2 diabetes mellitus with hyperglycemia: Secondary | ICD-10-CM | POA: Diagnosis not present

## 2021-01-24 DIAGNOSIS — I10 Essential (primary) hypertension: Secondary | ICD-10-CM | POA: Diagnosis not present

## 2021-01-24 DIAGNOSIS — J45909 Unspecified asthma, uncomplicated: Secondary | ICD-10-CM | POA: Diagnosis not present

## 2021-01-24 DIAGNOSIS — J449 Chronic obstructive pulmonary disease, unspecified: Secondary | ICD-10-CM | POA: Diagnosis not present

## 2021-01-24 DIAGNOSIS — F209 Schizophrenia, unspecified: Secondary | ICD-10-CM | POA: Diagnosis not present

## 2021-01-24 DIAGNOSIS — E785 Hyperlipidemia, unspecified: Secondary | ICD-10-CM | POA: Diagnosis not present

## 2021-01-24 DIAGNOSIS — E039 Hypothyroidism, unspecified: Secondary | ICD-10-CM | POA: Diagnosis not present

## 2021-01-24 DIAGNOSIS — L602 Onychogryphosis: Secondary | ICD-10-CM | POA: Diagnosis not present

## 2021-01-31 DIAGNOSIS — E1165 Type 2 diabetes mellitus with hyperglycemia: Secondary | ICD-10-CM | POA: Diagnosis not present

## 2021-01-31 DIAGNOSIS — F0390 Unspecified dementia without behavioral disturbance: Secondary | ICD-10-CM | POA: Diagnosis not present

## 2021-01-31 DIAGNOSIS — J45909 Unspecified asthma, uncomplicated: Secondary | ICD-10-CM | POA: Diagnosis not present

## 2021-01-31 DIAGNOSIS — E785 Hyperlipidemia, unspecified: Secondary | ICD-10-CM | POA: Diagnosis not present

## 2021-01-31 DIAGNOSIS — E039 Hypothyroidism, unspecified: Secondary | ICD-10-CM | POA: Diagnosis not present

## 2021-01-31 DIAGNOSIS — I10 Essential (primary) hypertension: Secondary | ICD-10-CM | POA: Diagnosis not present

## 2021-01-31 DIAGNOSIS — F209 Schizophrenia, unspecified: Secondary | ICD-10-CM | POA: Diagnosis not present

## 2021-01-31 DIAGNOSIS — L602 Onychogryphosis: Secondary | ICD-10-CM | POA: Diagnosis not present

## 2021-01-31 DIAGNOSIS — J449 Chronic obstructive pulmonary disease, unspecified: Secondary | ICD-10-CM | POA: Diagnosis not present

## 2021-02-07 DIAGNOSIS — F209 Schizophrenia, unspecified: Secondary | ICD-10-CM | POA: Diagnosis not present

## 2021-02-07 DIAGNOSIS — L602 Onychogryphosis: Secondary | ICD-10-CM | POA: Diagnosis not present

## 2021-02-07 DIAGNOSIS — E1165 Type 2 diabetes mellitus with hyperglycemia: Secondary | ICD-10-CM | POA: Diagnosis not present

## 2021-02-07 DIAGNOSIS — J45909 Unspecified asthma, uncomplicated: Secondary | ICD-10-CM | POA: Diagnosis not present

## 2021-02-07 DIAGNOSIS — E039 Hypothyroidism, unspecified: Secondary | ICD-10-CM | POA: Diagnosis not present

## 2021-02-07 DIAGNOSIS — F0390 Unspecified dementia without behavioral disturbance: Secondary | ICD-10-CM | POA: Diagnosis not present

## 2021-02-07 DIAGNOSIS — E785 Hyperlipidemia, unspecified: Secondary | ICD-10-CM | POA: Diagnosis not present

## 2021-02-07 DIAGNOSIS — J449 Chronic obstructive pulmonary disease, unspecified: Secondary | ICD-10-CM | POA: Diagnosis not present

## 2021-02-07 DIAGNOSIS — I10 Essential (primary) hypertension: Secondary | ICD-10-CM | POA: Diagnosis not present

## 2021-03-13 DIAGNOSIS — R413 Other amnesia: Secondary | ICD-10-CM | POA: Diagnosis not present

## 2021-03-13 DIAGNOSIS — E119 Type 2 diabetes mellitus without complications: Secondary | ICD-10-CM | POA: Diagnosis not present

## 2021-03-13 DIAGNOSIS — I10 Essential (primary) hypertension: Secondary | ICD-10-CM | POA: Diagnosis not present

## 2021-03-13 DIAGNOSIS — Z130 Encounter for screening for diseases of the blood and blood-forming organs and certain disorders involving the immune mechanism: Secondary | ICD-10-CM | POA: Diagnosis not present

## 2021-04-11 DIAGNOSIS — E860 Dehydration: Secondary | ICD-10-CM | POA: Diagnosis not present

## 2021-04-11 DIAGNOSIS — K047 Periapical abscess without sinus: Secondary | ICD-10-CM | POA: Diagnosis not present

## 2021-04-11 DIAGNOSIS — D519 Vitamin B12 deficiency anemia, unspecified: Secondary | ICD-10-CM | POA: Diagnosis not present

## 2021-04-11 DIAGNOSIS — R413 Other amnesia: Secondary | ICD-10-CM | POA: Diagnosis not present

## 2021-04-11 DIAGNOSIS — I10 Essential (primary) hypertension: Secondary | ICD-10-CM | POA: Diagnosis not present

## 2021-04-11 DIAGNOSIS — D509 Iron deficiency anemia, unspecified: Secondary | ICD-10-CM | POA: Diagnosis not present

## 2021-04-11 DIAGNOSIS — E119 Type 2 diabetes mellitus without complications: Secondary | ICD-10-CM | POA: Diagnosis not present

## 2021-04-11 DIAGNOSIS — Z130 Encounter for screening for diseases of the blood and blood-forming organs and certain disorders involving the immune mechanism: Secondary | ICD-10-CM | POA: Diagnosis not present

## 2021-04-25 DIAGNOSIS — R898 Other abnormal findings in specimens from other organs, systems and tissues: Secondary | ICD-10-CM | POA: Diagnosis not present

## 2021-04-25 DIAGNOSIS — I4891 Unspecified atrial fibrillation: Secondary | ICD-10-CM | POA: Diagnosis not present

## 2021-04-25 DIAGNOSIS — F0390 Unspecified dementia without behavioral disturbance: Secondary | ICD-10-CM | POA: Diagnosis not present

## 2021-04-25 DIAGNOSIS — I10 Essential (primary) hypertension: Secondary | ICD-10-CM | POA: Diagnosis not present

## 2021-04-25 DIAGNOSIS — R5383 Other fatigue: Secondary | ICD-10-CM | POA: Diagnosis not present

## 2021-04-25 DIAGNOSIS — I252 Old myocardial infarction: Secondary | ICD-10-CM | POA: Diagnosis not present

## 2021-06-01 DIAGNOSIS — I10 Essential (primary) hypertension: Secondary | ICD-10-CM | POA: Diagnosis not present

## 2021-06-01 DIAGNOSIS — E119 Type 2 diabetes mellitus without complications: Secondary | ICD-10-CM | POA: Diagnosis not present

## 2021-06-01 DIAGNOSIS — Z5181 Encounter for therapeutic drug level monitoring: Secondary | ICD-10-CM | POA: Diagnosis not present

## 2021-06-01 DIAGNOSIS — J449 Chronic obstructive pulmonary disease, unspecified: Secondary | ICD-10-CM | POA: Diagnosis not present

## 2021-06-01 DIAGNOSIS — F209 Schizophrenia, unspecified: Secondary | ICD-10-CM | POA: Diagnosis not present

## 2021-06-01 DIAGNOSIS — R413 Other amnesia: Secondary | ICD-10-CM | POA: Diagnosis not present

## 2021-06-12 DIAGNOSIS — E119 Type 2 diabetes mellitus without complications: Secondary | ICD-10-CM | POA: Diagnosis not present

## 2021-06-12 DIAGNOSIS — E118 Type 2 diabetes mellitus with unspecified complications: Secondary | ICD-10-CM | POA: Diagnosis not present

## 2021-06-12 DIAGNOSIS — Z130 Encounter for screening for diseases of the blood and blood-forming organs and certain disorders involving the immune mechanism: Secondary | ICD-10-CM | POA: Diagnosis not present

## 2021-06-12 DIAGNOSIS — Z5181 Encounter for therapeutic drug level monitoring: Secondary | ICD-10-CM | POA: Diagnosis not present

## 2021-06-12 DIAGNOSIS — I10 Essential (primary) hypertension: Secondary | ICD-10-CM | POA: Diagnosis not present

## 2021-06-12 DIAGNOSIS — E109 Type 1 diabetes mellitus without complications: Secondary | ICD-10-CM | POA: Diagnosis not present

## 2021-08-12 ENCOUNTER — Other Ambulatory Visit: Payer: Self-pay | Admitting: Neurology

## 2021-08-25 ENCOUNTER — Other Ambulatory Visit: Payer: Self-pay | Admitting: Neurology

## 2021-08-29 ENCOUNTER — Other Ambulatory Visit: Payer: Self-pay | Admitting: Neurology

## 2021-09-26 ENCOUNTER — Other Ambulatory Visit: Payer: Self-pay | Admitting: Neurology

## 2021-09-29 ENCOUNTER — Telehealth: Payer: Self-pay | Admitting: Neurology

## 2021-09-29 NOTE — Telephone Encounter (Signed)
Son called in regarding mothers donepezil. He made an appt for 12/12.

## 2021-09-29 NOTE — Telephone Encounter (Signed)
Pt will get refills at 12/12 appt last seen over a yr ago

## 2021-10-02 ENCOUNTER — Other Ambulatory Visit: Payer: Self-pay

## 2021-10-02 ENCOUNTER — Encounter: Payer: Self-pay | Admitting: Neurology

## 2021-10-02 ENCOUNTER — Telehealth (INDEPENDENT_AMBULATORY_CARE_PROVIDER_SITE_OTHER): Payer: Medicare HMO | Admitting: Neurology

## 2021-10-02 VITALS — Ht 60.0 in

## 2021-10-02 DIAGNOSIS — G3184 Mild cognitive impairment, so stated: Secondary | ICD-10-CM | POA: Diagnosis not present

## 2021-10-02 MED ORDER — DONEPEZIL HCL 10 MG PO TABS: 10.0000 mg | ORAL_TABLET | Freq: Every day | ORAL | 3 refills | Status: AC

## 2021-10-02 NOTE — Progress Notes (Signed)
Virtual Visit via Video Note The purpose of this virtual visit is to provide medical care while limiting exposure to the novel coronavirus.    Consent was obtained for video visit:  Yes.   Answered questions that patient had about telehealth interaction:  Yes.   I discussed the limitations, risks, security and privacy concerns of performing an evaluation and management service by telemedicine. I also discussed with the patient that there may be a patient responsible charge related to this service. The patient expressed understanding and agreed to proceed.  Pt location: Home Physician Location: office Name of referring provider:  Mikael Spray, NP I connected with Jody Livingston at patients initiation/request on 10/02/2021 at 10:00 AM EST by video enabled telemedicine application and verified that I am speaking with the correct person using two identifiers. Pt MRN:  [id number] Pt DOB:  Dec 09, 1950 Video Participants:  Jody Livingston;  Jody Livingston (daughter)   History of Present Illness:  The patient had a virtual video visit on 10/02/2021. She is accompanied by her daughter Jody Livingston who helps supplement the history today. She was last seen in the neurology clinic over a year ago for Mild Neurocognitive Disorder concerning for Alzheimer's disease. Neuropsychological evaluation in November 2020 shwoed primary impairments across verbal and visual memory, including exhibiting an amnestic profile across all administered memory tests. She also had weakness across verbal fluency and response inhibition. She performed well with other tasks suggesting that she is at the early stages. It was also noted that a history of schizophrenia has been shown to be associated with cognitive deficits, and mild psychiatric distress (mild to moderate anxiety) can also influence cognitive efficiency. She is on Donepezil 10mg  daily without side effects.  They asked to switch to a virtual visit 10 minutes  prior to the appointment. She says she is having a bad day. She is rocking her upper body, answering questions. She continues to live alone. She has not seen her psychiatrist recently, was under the impression our office is prescribing the Risperdal and Depakote on her medication list, discussed that our office only prescribes the Donepezil. She feels her memory is "not any better." Jody Livingston states her memory is "not really good." She continues to manage her own medications and states she remembers it "most of the time." Family helps with finances, she has missed some payments. She does not drive, she does not cook. She uses the microwave and denies any difficulties. She is independent with dressing and bathing. Jody Livingston reports she is not able to get into the tub so she mostly washes her hair in the sink. She states sometimes she uses her shower chair. She denies any issues using the washing machine. She reports sleep is good. When asked about hallucinations or paranoia, it is unclear if Jody Livingston is hesitant to speak in front of the patient but states she does not know about any hallucinations/paranoia.    History on Initial Assessment 12/17/2018: This is a 69 year old left-handed woman with a history of hypertension, hyperlipidemia, diabetes, CAD, schizophrenia, presenting for evaluation of epilepsy and depakote level <4. The patient and her daughter 71 deny any prior history of epilepsy or seizures. They both deny any prior history of convulsions, staring/unresponsive episodes, or any seizure-like symptoms. There are no prior records for review. She states the Depakote is being prescribed by her psychiatrist Dr. Cordelia Livingston. She states that "I was complaining about memory loss." Bayard Males does not provide much information and states she does not know  much because the patient lives with her other daughter, [name]. Jody Livingston tried to get in contact with [name] to provide additional information, and [name] sent a  message: "she sees Dr. Bayard Males for schizophrenia, dementia, COPD, tachycardia, hypertension, diabetes. Why is she not remembering things correctly when supposedly taking all her medication?" The patient states that [name] lives with her, that the patient pays the rent and utilities and denies missing bills. She stopped driving 2 years ago, it is unclear what exactly occurred, but she denies getting lost and states family would not bring her. Jody Livingston reports she stopped working 25 years ago, she was driving and did not know where she was going, she would go to work and keep driving, not knowing where she would end up. Jody Livingston reports she forgets to eat. She states she sometimes forgets her medications, and that [name] does not help her. She puts her medications in a pill caddy and states that her sone comes and "moves them every which way, not in the right places." She states that "he says I don't know what I am doing" and insisted to take her to a psychiatrist. Each time she goes to Dr. Bayard Males and complains about side effects of Depakote, "he asks me to increase it, they are trying to prescribe 4 but I am only taking 1." She states she only started seeing a psychiatrist 2 years ago and that prior to this she had never seen one. Her daughter reminds her that she kept going to the inpatient Psychiatric hospital in [place] 10 years ago and was discharged home on Risperdal.   She denies any headaches, dizziness, diplopia, dysarthria/dysphagia, neck/back pain, focal numbness/tingling/weakness, bowel/bladder dysfunction, tremors. She has noticed a decreased sense of smell. She has had bedwetting since childhood. She denies any olfactory/gustatory hallucinations, rising epigastric sensation, myoclonic jerks. No family history of dementia or seizures. No history of significant head injuries, alcohol intake. No history of febrile convulsions, CNS infections, neurosurgical procedures.     Current Outpatient  Medications on File Prior to Visit  Medication Sig Dispense Refill   clopidogrel (PLAVIX) 75 MG tablet Take 75 mg by mouth daily.     COMBIVENT RESPIMAT 20-100 MCG/ACT AERS respimat INHALE 1 PUFF BY MOUTH 4 TIMES DAILY     D3-1000 25 MCG (1000 UT) capsule Take 1,000 Units by mouth daily.     diltiazem (CARDIZEM CD) 180 MG 24 hr capsule Take 180 mg by mouth daily.     divalproex (DEPAKOTE ER) 250 MG 24 hr tablet Take 750 mg by mouth at bedtime.      donepezil (ARICEPT) 10 MG tablet TAKE ONE TABLET BY MOUTH EVERY DAY 90 tablet 3   glimepiride (AMARYL) 4 MG tablet TAKE ONE TABLET BY MOUTH EVERY MORNING FOR DIABETES     metFORMIN (GLUCOPHAGE) 500 MG tablet Take 500 mg by mouth daily.     montelukast (SINGULAIR) 10 MG tablet Take 10 mg by mouth daily.     risperiDONE (RISPERDAL) 0.5 MG tablet Take 0.5 mg by mouth at bedtime.     risperidone (RISPERDAL) 4 MG tablet Take 4 mg by mouth 2 (two) times daily.     rosuvastatin (CRESTOR) 20 MG tablet Take 20 mg by mouth daily.      No current facility-administered medications on file prior to visit.     Observations/Objective:   Vitals:   10/02/21 1006  Height: 5' (1.524 m)   GEN:  The patient appears stated age and is in NAD.  Neurological examination:  Patient is awake, alert,. No aphasia or dysarthria. Intact fluency and comprehension. Cranial nerves: Extraocular movements intact with no nystagmus. No facial asymmetry. Motor: moves all extremities symmetrically, at least anti-gravity x 4.   Assessment and Plan:   This is a 70 yo LH woman with a history of hypertension, hyperlipidemia, diabetes, CAD, schizophrenia, who initially presented for evaluation of epilepsy and depakote level <4, but on further questioning, she has no history of epilepsy or seizures and takes Depakote for mood stabilization and that the main concern was for memory loss. Neuropsychological testing in November 2020 indicated a diagnosis of Mild Neurocognitive Disorder,  possibly due to Alzheimer's disease, but in early stages. She continues to live alone but there appears to be more issues with complex tasks, difficult to discern if family is hesitant to speak in front of patient fully. She will be scheduled for repeat Neuropsychological testing. Continue Donepezil 10mg  daily. Advised to follow-up with her psychiatrist. She does not drive. Continue close supervision. Follow-up in 6 months, call for any changes.     Follow Up Instructions:    -I discussed the assessment and treatment plan with the patient. The patient was provided an opportunity to ask questions and all were answered. The patient agreed with the plan and demonstrated an understanding of the instructions.   The patient was advised to call back or seek an in-person evaluation if the symptoms worsen or if the condition fails to improve as anticipated.    , MD

## 2021-10-02 NOTE — Patient Instructions (Signed)
Continue Donepezil 10mg  daily  Schedule repeat Neuropsychological evaluation  3. Please contact your psychiatrist for refills of your psychiatric medications  4. Follow-up in 6 months, call for any changes   FALL PRECAUTIONS: Be cautious when walking. Scan the area for obstacles that may increase the risk of trips and falls. When getting up in the mornings, sit up at the edge of the bed for a few minutes before getting out of bed. Consider elevating the bed at the head end to avoid drop of blood pressure when getting up. Walk always in a well-lit room (use night lights in the walls). Avoid area rugs or power cords from appliances in the middle of the walkways. Use a walker or a cane if necessary and consider physical therapy for balance exercise. Get your eyesight checked regularly.  FINANCIAL OVERSIGHT: Supervision, especially oversight when making financial decisions or transactions is also recommended.  HOME SAFETY: Consider the safety of the kitchen when operating appliances like stoves, microwave oven, and blender. Consider having supervision and share cooking responsibilities until no longer able to participate in those. Accidents with firearms and other hazards in the house should be identified and addressed as well.  ABILITY TO BE LEFT ALONE: If patient is unable to contact 911 operator, consider using [employer], or when the need is there, arrange for someone to stay with patients. Smoking is a fire hazard, consider supervision or cessation. Risk of wandering should be assessed by caregiver and if detected at any point, supervision and safe proof recommendations should be instituted.  MEDICATION SUPERVISION: Inability to self-administer medication needs to be constantly addressed. Implement a mechanism to ensure safe administration of the medications.  RECOMMENDATIONS FOR ALL PATIENTS WITH MEMORY PROBLEMS: 1. Continue to exercise (Recommend 30 minutes of walking everyday, or 3 hours every  week) 2. Increase social interactions - continue going to [place] and enjoy social gatherings with friends and family 3. Eat healthy, avoid fried foods and eat more fruits and vegetables 4. Maintain adequate blood pressure, blood sugar, and blood cholesterol level. Reducing the risk of stroke and cardiovascular disease also helps promoting better memory. 5. Avoid stressful situations. Live a simple life and avoid aggravations. Organize your time and prepare for the next day in anticipation. 6. Sleep well, avoid any interruptions of sleep and avoid any distractions in the bedroom that may interfere with adequate sleep quality 7. Avoid sugar, avoid sweets as there is a strong link between excessive sugar intake, diabetes, and cognitive impairment The Mediterranean diet has been shown to help patients reduce the risk of progressive memory disorders and reduces cardiovascular risk. This includes eating fish, eat fruits and green leafy vegetables, nuts like almonds and hazelnuts, walnuts, and also use olive oil. Avoid fast foods and fried foods as much as possible. Avoid sweets and sugar as sugar use has been linked to worsening of memory function.  There is always a concern of gradual progression of memory problems. If this is the case, then we may need to adjust level of care according to patient needs. Support, both to the patient and caregiver, should then be put into place.

## 2021-10-03 ENCOUNTER — Encounter: Payer: Self-pay | Admitting: Neurology

## 2021-11-14 DIAGNOSIS — E119 Type 2 diabetes mellitus without complications: Secondary | ICD-10-CM | POA: Diagnosis not present

## 2021-11-14 DIAGNOSIS — E559 Vitamin D deficiency, unspecified: Secondary | ICD-10-CM | POA: Diagnosis not present

## 2021-11-14 DIAGNOSIS — E7800 Pure hypercholesterolemia, unspecified: Secondary | ICD-10-CM | POA: Diagnosis not present

## 2021-11-14 DIAGNOSIS — D519 Vitamin B12 deficiency anemia, unspecified: Secondary | ICD-10-CM | POA: Diagnosis not present

## 2021-11-14 DIAGNOSIS — Z79899 Other long term (current) drug therapy: Secondary | ICD-10-CM | POA: Diagnosis not present

## 2021-11-14 DIAGNOSIS — R300 Dysuria: Secondary | ICD-10-CM | POA: Diagnosis not present

## 2021-11-14 DIAGNOSIS — Z5181 Encounter for therapeutic drug level monitoring: Secondary | ICD-10-CM | POA: Diagnosis not present

## 2021-11-14 DIAGNOSIS — Q845 Enlarged and hypertrophic nails: Secondary | ICD-10-CM | POA: Diagnosis not present

## 2021-11-14 DIAGNOSIS — I10 Essential (primary) hypertension: Secondary | ICD-10-CM | POA: Diagnosis not present

## 2021-12-14 ENCOUNTER — Ambulatory Visit (INDEPENDENT_AMBULATORY_CARE_PROVIDER_SITE_OTHER): Payer: Medicare HMO | Admitting: Podiatry

## 2021-12-14 ENCOUNTER — Ambulatory Visit: Payer: Medicare HMO | Admitting: Podiatry

## 2021-12-14 ENCOUNTER — Encounter: Payer: Self-pay | Admitting: Podiatry

## 2021-12-14 DIAGNOSIS — Q6671 Congenital pes cavus, right foot: Secondary | ICD-10-CM

## 2021-12-14 DIAGNOSIS — M2011 Hallux valgus (acquired), right foot: Secondary | ICD-10-CM | POA: Diagnosis not present

## 2021-12-14 DIAGNOSIS — M79674 Pain in right toe(s): Secondary | ICD-10-CM | POA: Diagnosis not present

## 2021-12-14 DIAGNOSIS — M2012 Hallux valgus (acquired), left foot: Secondary | ICD-10-CM

## 2021-12-14 DIAGNOSIS — L84 Corns and callosities: Secondary | ICD-10-CM | POA: Diagnosis not present

## 2021-12-14 DIAGNOSIS — Q6672 Congenital pes cavus, left foot: Secondary | ICD-10-CM

## 2021-12-14 DIAGNOSIS — B351 Tinea unguium: Secondary | ICD-10-CM | POA: Diagnosis not present

## 2021-12-14 DIAGNOSIS — E119 Type 2 diabetes mellitus without complications: Secondary | ICD-10-CM

## 2021-12-14 DIAGNOSIS — M79675 Pain in left toe(s): Secondary | ICD-10-CM | POA: Diagnosis not present

## 2021-12-14 NOTE — Patient Instructions (Signed)
Diabetes Mellitus and Foot Care Foot care is an important part of your health, especially when you have diabetes. Diabetes may cause you to have problems because of poor blood flow (circulation) to your feet and legs, which can cause your skin to: Become thinner and drier. Break more easily. Heal more slowly. Peel and crack. You may also have nerve damage (neuropathy) in your legs and feet, causing decreased feeling in them. This means that you may not notice minor injuries to your feet that could lead to more serious problems. Noticing and addressing any potential problems early is the best wayto prevent future foot problems. How to care for your feet Foot hygiene  Wash your feet daily with warm water and mild soap. Do not use hot water. Then, pat your feet and the areas between your toes until they are completely dry. Do not soak your feet as this can dry your skin. Trim your toenails straight across. Do not dig under them or around the cuticle. File the edges of your nails with an emery board or nail file. Apply a moisturizing lotion or petroleum jelly to the skin on your feet and to dry, brittle toenails. Use lotion that does not contain alcohol and is unscented. Do not apply lotion between your toes.  Shoes and socks Wear clean socks or stockings every day. Make sure they are not too tight. Do not wear knee-high stockings since they may decrease blood flow to your legs. Wear shoes that fit properly and have enough cushioning. Always look in your shoes before you put them on to be sure there are no objects inside. To break in new shoes, wear them for just a few hours a day. This prevents injuries on your feet. Wounds, scrapes, corns, and calluses  Check your feet daily for blisters, cuts, bruises, sores, and redness. If you cannot see the bottom of your feet, use a mirror or ask someone for help. Do not cut corns or calluses or try to remove them with medicine. If you find a minor scrape,  cut, or break in the skin on your feet, keep it and the skin around it clean and dry. You may clean these areas with mild soap and water. Do not clean the area with peroxide, alcohol, or iodine. If you have a wound, scrape, corn, or callus on your foot, look at it several times a day to make sure it is healing and not infected. Check for: Redness, swelling, or pain. Fluid or blood. Warmth. Pus or a bad smell.  General tips Do not cross your legs. This may decrease blood flow to your feet. Do not use heating pads or hot water bottles on your feet. They may burn your skin. If you have lost feeling in your feet or legs, you may not know this is happening until it is too late. Protect your feet from hot and cold by wearing shoes, such as at the beach or on hot pavement. Schedule a complete foot exam at least once a year (annually) or more often if you have foot problems. Report any cuts, sores, or bruises to your health care provider immediately. Where to find more information American Diabetes Association: www.diabetes.org Association of Diabetes [employer] & Education Specialists: www.diabeteseducator.org Contact a health care provider if: You have a medical condition that increases your risk of infection and you have any cuts, sores, or bruises on your feet. You have an injury that is not healing. You have redness on your legs or feet.   You feel burning or tingling in your legs or feet. You have pain or cramps in your legs and feet. Your legs or feet are numb. Your feet always feel cold. You have pain around any toenails. Get help right away if: You have a wound, scrape, corn, or callus on your foot and: You have pain, swelling, or redness that gets worse. You have fluid or blood coming from the wound, scrape, corn, or callus. Your wound, scrape, corn, or callus feels warm to the touch. You have pus or a bad smell coming from the wound, scrape, corn, or callus. You have a fever. You have a red  line going up your leg. Summary Check your feet every day for blisters, cuts, bruises, sores, and redness. Apply a moisturizing lotion or petroleum jelly to the skin on your feet and to dry, brittle toenails. Wear shoes that fit properly and have enough cushioning. If you have foot problems, report any cuts, sores, or bruises to your health care provider immediately. Schedule a complete foot exam at least once a year (annually) or more often if you have foot problems. This information is not intended to replace advice given to you by your health care provider. Make sure you discuss any questions you have with your healthcare provider. Document Revised: 04/28/2020 Document Reviewed: 04/28/2020 Elsevier Patient Education  2022 [employer]   Onychomycosis/Fungal Toenails  WHAT IS IT? An infection that lies within the keratin of your nail plate that is caused by a fungus.  WHY ME? Fungal infections affect all ages, sexes, races, and creeds.  There may be many factors that predispose you to a fungal infection such as age, coexisting medical conditions such as diabetes, or an autoimmune disease; stress, medications, fatigue, genetics, etc.  Bottom line: fungus thrives in a warm, moist environment and your shoes offer such a location.  IS IT CONTAGIOUS? Theoretically, yes.  You do not want to share shoes, nail clippers or files with someone who has fungal toenails.  Walking around barefoot in the same room or sleeping in the same bed is unlikely to transfer the organism.  It is important to realize, however, that fungus can spread easily from one nail to the next on the same foot.  HOW DO WE TREAT THIS?  There are several ways to treat this condition.  Treatment may depend on many factors such as age, medications, pregnancy, liver and kidney conditions, etc.  It is best to ask your doctor which options are available to you.  No treatment.   Unlike many other medical concerns, you can live with this  condition.  However for many people this can be a painful condition and may lead to ingrown toenails or a bacterial infection.  It is recommended that you keep the nails cut short to help reduce the amount of fungal nail. Topical treatment.  These range from herbal remedies to prescription strength nail lacquers.  About 40-50% effective, topicals require twice daily application for approximately 9 to 12 months or until an entirely new nail has grown out.  The most effective topicals are medical grade medications available through physicians offices. Oral antifungal medications.  With an 80-90% cure rate, the most common oral medication requires 3 to 4 months of therapy and stays in your system for a year as the new nail grows out.  Oral antifungal medications do require blood work to make sure it is a safe drug for you.  A liver function panel will be performed prior to   starting the medication and after the first month of treatment.  It is important to have the blood work performed to avoid any harmful side effects.  In general, this medication safe but blood work is required. Laser Therapy.  This treatment is performed by applying a specialized laser to the affected nail plate.  This therapy is noninvasive, fast, and non-painful.  It is not covered by insurance and is therefore, out of pocket.  The results have been very good with a 80-95% cure rate.  The [facility] is the only practice in the area to offer this therapy. Permanent Nail Avulsion.  Removing the entire nail so that a new nail will not grow back. 

## 2021-12-19 NOTE — Progress Notes (Signed)
ANNUAL DIABETIC FOOT EXAM  Subjective: Jody Livingston presents today for annual diabetic foot examination.  Patient relates 10 year h/o diabetes.  Patient denies any h/o foot wounds.  Patient denies any numbness, tingling, burning, or pins/needle sensation in feet.  Patient does not monitor blood glucose daily.  Risk factors: HTN, CAD, hyperlipidemia, h/o tobacco use in remission.  Adela Glimpse, NP is patient's PCP. Last visit was June 12, 2021.  Past Medical History:  Diagnosis Date   CAD (coronary artery disease)    COPD (chronic obstructive pulmonary disease)    Diabetes mellitus without complication    Generalized anxiety disorder    Hyperlipidemia    Hypertension    Mild neurocognitive disorder 08/31/2019   Schizophrenia    Patient Active Problem List   Diagnosis Date Noted   Hypothyroidism, unspecified 10/22/2020   Long term (current) use of aspirin 10/22/2020   Long term (current) use of oral hypoglycemic drugs 10/22/2020   Onychogryphosis 10/22/2020   Personal history of nicotine dependence 10/22/2020   Unspecified asthma, uncomplicated 10/22/2020   Mild neurocognitive disorder 08/31/2019   Diabetes mellitus (HCC)    Schizophrenia    Hyperlipidemia    COPD (chronic obstructive pulmonary disease)    Hypertension    CAD (coronary artery disease)    Subclavian artery stenosis, left (HCC) 07/15/2017   Short-term memory loss 05/23/2016   Atypical chest pain 04/30/2016   History reviewed. No pertinent surgical history. Current Outpatient Medications on File Prior to Visit  Medication Sig Dispense Refill   ACCU-CHEK AVIVA PLUS test strip SMARTSIG:Via Meter     Accu-Chek FastClix Lancets MISC Apply topically.     clopidogrel (PLAVIX) 75 MG tablet Take 75 mg by mouth daily.     COMBIVENT RESPIMAT 20-100 MCG/ACT AERS respimat INHALE 1 PUFF BY MOUTH 4 TIMES DAILY     D3-1000 25 MCG (1000 UT) capsule Take 1,000 Units by mouth daily.     diltiazem (CARDIZEM  CD) 180 MG 24 hr capsule Take 180 mg by mouth daily.     divalproex (DEPAKOTE ER) 250 MG 24 hr tablet Take 750 mg by mouth at bedtime.      donepezil (ARICEPT) 10 MG tablet Take 1 tablet (10 mg total) by mouth daily. 90 tablet 3   famotidine (PEPCID) 40 MG tablet Take by mouth.     glimepiride (AMARYL) 4 MG tablet TAKE ONE TABLET BY MOUTH EVERY MORNING FOR DIABETES     metFORMIN (GLUCOPHAGE) 500 MG tablet Take 500 mg by mouth daily.     montelukast (SINGULAIR) 10 MG tablet Take 10 mg by mouth daily.     risperiDONE (RISPERDAL) 0.5 MG tablet Take 0.5 mg by mouth at bedtime.     rosuvastatin (CRESTOR) 20 MG tablet Take 20 mg by mouth daily.      No current facility-administered medications on file prior to visit.    Allergies  Allergen Reactions   Diazepam    Oxycodone-Acetaminophen Nausea And Vomiting   Social History   Occupational History   Not on file  Tobacco Use   Smoking status: Former    Types: Cigarettes    Quit date: 2019    Years since quitting: 4.1   Smokeless tobacco: Never  Vaping Use   Vaping Use: Never used  Substance and Sexual Activity   Alcohol use: Not Currently   Drug use: Never   Sexual activity: Not Currently    Partners: Female   History reviewed. No pertinent family history.  There is  no immunization history on file for this patient.   Review of Systems: Negative except as noted in the HPI.   Objective: There were no vitals filed for this visit.  Jody Livingston is a pleasant 71 y.o. female in NAD. AAO X 3.  Vascular Examination: CFT <3 seconds b/l LE. Palpable DP/PT pulses b/l LE. Digital hair present b/l. Skin temperature gradient WNL b/l. No pain with calf compression b/l. No edema noted b/l. No cyanosis or clubbing noted b/l LE.  Dermatological Examination: Pedal integument with normal turgor, texture and tone b/l LE. No open wounds b/l. No interdigital macerations b/l. Toenails 1-5 b/l elongated, thickened, discolored with subungual  debris. +Tenderness with dorsal palpation of nailplates. Hyperkeratotic lesion(s) noted bilateral heels.  Neurological Examination: Protective sensation intact 5/5 intact bilaterally with 10g monofilament b/l. Vibratory sensation intact b/l. Deep tendon reflexes normal b/l.   Musculoskeletal Examination: Muscle strength 5/5 to all lower extremity muscle groups bilaterally. No pain, crepitus or joint limitation noted with ROM bilateral LE. Hallux valgus with bunion deformity noted b/l lower extremities. Pes cavus deformity noted b/l lower extremities.  Footwear Assessment: Does the patient wear appropriate shoes? Yes. Does the patient need inserts/orthotics? Yes.  No flowsheet data found.  Assessment: 1. Pain due to onychomycosis of toenails of both feet   2. Callus   3. Pes cavus of both feet   4. Hallux valgus, acquired, bilateral   5. Type 2 diabetes mellitus without complication, without long-term current use of insulin (HCC)   6. Encounter for diabetic foot exam (HCC)      ADA Risk Categorization: Low Risk :  Patient has all of the following: Intact protective sensation No prior foot ulcer  No severe deformity Pedal pulses present  Plan: -Examined patient. -Diabetic foot examination performed today. -Continue foot and shoe inspections daily. Monitor blood glucose per PCP/Endocrinologist's recommendations. -Discussed diabetic foot care principles. Literature dispensed on today. -Toenails 1-5 b/l were debrided in length and girth with sterile nail nippers and dremel without iatrogenic bleeding.  -Callus(es) bilateral heels pared utilizing sterile scalpel blade without complication or incident. Total number debrided =2. -Patient/POA to call should there be question/concern in the interim. Return in about 3 months (around 03/13/2022).  Freddie Breech, DPM

## 2022-02-14 DIAGNOSIS — I10 Essential (primary) hypertension: Secondary | ICD-10-CM | POA: Diagnosis not present

## 2022-02-14 DIAGNOSIS — E1165 Type 2 diabetes mellitus with hyperglycemia: Secondary | ICD-10-CM | POA: Diagnosis not present

## 2022-02-14 DIAGNOSIS — D519 Vitamin B12 deficiency anemia, unspecified: Secondary | ICD-10-CM | POA: Diagnosis not present

## 2022-02-14 DIAGNOSIS — F0392 Unspecified dementia, unspecified severity, with psychotic disturbance: Secondary | ICD-10-CM | POA: Diagnosis not present

## 2022-02-14 DIAGNOSIS — F209 Schizophrenia, unspecified: Secondary | ICD-10-CM | POA: Diagnosis not present

## 2022-02-14 DIAGNOSIS — D509 Iron deficiency anemia, unspecified: Secondary | ICD-10-CM | POA: Diagnosis not present

## 2022-02-14 DIAGNOSIS — Z0001 Encounter for general adult medical examination with abnormal findings: Secondary | ICD-10-CM | POA: Diagnosis not present

## 2022-02-14 DIAGNOSIS — I519 Heart disease, unspecified: Secondary | ICD-10-CM | POA: Diagnosis not present

## 2022-02-14 DIAGNOSIS — E782 Mixed hyperlipidemia: Secondary | ICD-10-CM | POA: Diagnosis not present

## 2022-03-06 DIAGNOSIS — E118 Type 2 diabetes mellitus with unspecified complications: Secondary | ICD-10-CM | POA: Diagnosis not present

## 2022-03-06 DIAGNOSIS — E039 Hypothyroidism, unspecified: Secondary | ICD-10-CM | POA: Diagnosis not present

## 2022-03-06 DIAGNOSIS — Z713 Dietary counseling and surveillance: Secondary | ICD-10-CM | POA: Diagnosis not present

## 2022-03-06 DIAGNOSIS — F0390 Unspecified dementia without behavioral disturbance: Secondary | ICD-10-CM | POA: Diagnosis not present

## 2022-03-06 DIAGNOSIS — I10 Essential (primary) hypertension: Secondary | ICD-10-CM | POA: Diagnosis not present

## 2022-03-06 DIAGNOSIS — D509 Iron deficiency anemia, unspecified: Secondary | ICD-10-CM | POA: Diagnosis not present

## 2022-03-06 DIAGNOSIS — D519 Vitamin B12 deficiency anemia, unspecified: Secondary | ICD-10-CM | POA: Diagnosis not present

## 2022-03-06 DIAGNOSIS — E7800 Pure hypercholesterolemia, unspecified: Secondary | ICD-10-CM | POA: Diagnosis not present

## 2022-03-06 DIAGNOSIS — I519 Heart disease, unspecified: Secondary | ICD-10-CM | POA: Diagnosis not present

## 2022-03-08 ENCOUNTER — Ambulatory Visit: Payer: Medicare HMO | Admitting: Neurology

## 2022-03-15 ENCOUNTER — Encounter: Payer: Self-pay | Admitting: Podiatry

## 2022-03-15 ENCOUNTER — Ambulatory Visit: Payer: Medicare HMO | Admitting: Podiatry

## 2022-03-15 DIAGNOSIS — Q828 Other specified congenital malformations of skin: Secondary | ICD-10-CM | POA: Diagnosis not present

## 2022-03-15 DIAGNOSIS — E119 Type 2 diabetes mellitus without complications: Secondary | ICD-10-CM

## 2022-03-15 DIAGNOSIS — M79675 Pain in left toe(s): Secondary | ICD-10-CM | POA: Diagnosis not present

## 2022-03-15 DIAGNOSIS — M79674 Pain in right toe(s): Secondary | ICD-10-CM | POA: Diagnosis not present

## 2022-03-15 DIAGNOSIS — B351 Tinea unguium: Secondary | ICD-10-CM

## 2022-03-21 NOTE — Progress Notes (Signed)
  Subjective:  Patient ID: Jody Livingston, female    DOB: 1950-12-16,  MRN: [id number]  Jody Livingston presents to clinic today for preventative diabetic foot care and painful porokeratotic lesion(s) left lower extremity and painful mycotic toenails that limit ambulation. Painful toenails interfere with ambulation. Aggravating factors include wearing enclosed shoe gear. Pain is relieved with periodic professional debridement. Painful porokeratotic lesions are aggravated when weightbearing with and without shoegear. Pain is relieved with periodic professional debridement.   Last known HgA1c was unknown. Patient did not check blood glucose today.  New problem(s): None.   PCP is Adela Glimpse, NP , and last visit was November 14, 2021.  Allergies  Allergen Reactions   Diazepam    Oxycodone-Acetaminophen Nausea And Vomiting    Review of Systems: Negative except as noted in the HPI.  Objective: No changes noted in today's physical examination. There were no vitals filed for this visit.  Jody Livingston is a pleasant 71 y.o. female in NAD. AAO X 3.  Vascular Examination: CFT <3 seconds b/l LE. Palpable DP/PT pulses b/l LE. Digital hair present b/l. Skin temperature gradient WNL b/l. No pain with calf compression b/l. No edema noted b/l. No cyanosis or clubbing noted b/l LE.  Dermatological Examination: Pedal integument with normal turgor, texture and tone b/l LE. No open wounds b/l. No interdigital macerations b/l. Toenails 1-5 b/l elongated, thickened, discolored with subungual debris. +Tenderness with dorsal palpation of nailplates. Hyperkeratotic lesion(s) noted bilateral heels.  Neurological Examination: Protective sensation intact 5/5 intact bilaterally with 10g monofilament b/l. Vibratory sensation intact b/l. Deep tendon reflexes normal b/l.   Musculoskeletal Examination: Muscle strength 5/5 to all lower extremity muscle groups bilaterally. No pain, crepitus or  joint limitation noted with ROM bilateral LE. Hallux valgus with bunion deformity noted b/l lower extremities. Pes cavus deformity noted b/l lower extremities.  Assessment/Plan: 1. Pain due to onychomycosis of toenails of both feet   2. Porokeratosis   3. Type 2 diabetes mellitus without complication, without long-term current use of insulin (HCC)     -Patient was evaluated and treated. All patient's and/or POA's questions/concerns answered on today's visit. -Continue diabetic foot care principles: inspect feet daily, monitor glucose as recommended by PCP and/or Endocrinologist, and follow prescribed diet per PCP, Endocrinologist and/or dietician. -Patient to continue soft, supportive shoe gear daily. -Mycotic toenails 1-5 bilaterally were debrided in length and girth with sterile nail nippers and dremel without incident. -Porokeratotic lesion(s) submet head 4 left foot pared and enucleated with sterile scalpel blade without incident. Total number of lesions debrided=1. -Patient/POA to call should there be question/concern in the interim.   Return in about 3 months (around 06/15/2022).  Freddie Breech, DPM

## 2022-04-11 ENCOUNTER — Encounter: Payer: Self-pay | Admitting: Psychology

## 2022-04-11 DIAGNOSIS — F411 Generalized anxiety disorder: Secondary | ICD-10-CM | POA: Insufficient documentation

## 2022-04-12 ENCOUNTER — Encounter: Payer: Medicare HMO | Admitting: Psychology

## 2022-04-12 ENCOUNTER — Encounter: Payer: Self-pay | Admitting: Psychology

## 2022-04-12 DIAGNOSIS — I700 Atherosclerosis of aorta: Secondary | ICD-10-CM | POA: Diagnosis not present

## 2022-04-12 DIAGNOSIS — R1110 Vomiting, unspecified: Secondary | ICD-10-CM | POA: Diagnosis not present

## 2022-04-12 DIAGNOSIS — R112 Nausea with vomiting, unspecified: Secondary | ICD-10-CM | POA: Diagnosis not present

## 2022-04-12 DIAGNOSIS — R109 Unspecified abdominal pain: Secondary | ICD-10-CM | POA: Diagnosis not present

## 2022-04-12 DIAGNOSIS — R197 Diarrhea, unspecified: Secondary | ICD-10-CM | POA: Diagnosis not present

## 2022-04-19 ENCOUNTER — Encounter: Payer: Medicare HMO | Admitting: Psychology

## 2022-04-26 ENCOUNTER — Ambulatory Visit: Payer: Medicare HMO | Admitting: Physician Assistant

## 2022-04-27 DIAGNOSIS — Z1231 Encounter for screening mammogram for malignant neoplasm of breast: Secondary | ICD-10-CM | POA: Diagnosis not present

## 2022-05-03 ENCOUNTER — Ambulatory Visit: Payer: Medicare HMO | Admitting: Physician Assistant

## 2022-06-12 DIAGNOSIS — H2513 Age-related nuclear cataract, bilateral: Secondary | ICD-10-CM | POA: Diagnosis not present

## 2022-06-12 DIAGNOSIS — H353131 Nonexudative age-related macular degeneration, bilateral, early dry stage: Secondary | ICD-10-CM | POA: Diagnosis not present

## 2022-06-12 DIAGNOSIS — E113293 Type 2 diabetes mellitus with mild nonproliferative diabetic retinopathy without macular edema, bilateral: Secondary | ICD-10-CM | POA: Diagnosis not present

## 2022-06-19 ENCOUNTER — Encounter: Payer: Self-pay | Admitting: Physician Assistant

## 2022-06-19 ENCOUNTER — Ambulatory Visit (INDEPENDENT_AMBULATORY_CARE_PROVIDER_SITE_OTHER): Payer: Medicare HMO | Admitting: Physician Assistant

## 2022-06-19 VITALS — BP 140/88 | HR 74 | Resp 18 | Ht 60.0 in | Wt 175.0 lb

## 2022-06-19 DIAGNOSIS — F0280 Dementia in other diseases classified elsewhere without behavioral disturbance: Secondary | ICD-10-CM | POA: Diagnosis not present

## 2022-06-19 DIAGNOSIS — G3184 Mild cognitive impairment, so stated: Secondary | ICD-10-CM | POA: Diagnosis not present

## 2022-06-19 DIAGNOSIS — G309 Alzheimer's disease, unspecified: Secondary | ICD-10-CM

## 2022-06-19 NOTE — Patient Instructions (Addendum)
Continue Donepezil 10mg  daily  2 Start Memantine 10 mg: Take 1 tablet (10 mg at night) for 2 weeks, then increase to 1 tablet (10 mg) twice a day.    Schedule repeat Neuropsychological evaluation 3. Please contact your psychiatrist for refills of your psychiatric medications 4. Follow-up in 8 months, call for any changes  It was a pleasure to see you today at our office.      Whom to call:  Memory  decline, memory medications: Call our office [phone]   For psychiatric meds, mood meds: Please have your primary care physician manage these medications.   Counseling regarding caregiver distress, including caregiver depression, anxiety and issues regarding community resources, adult day care programs, adult living facilities, or memory care questions:   Feel free to contact Misty [phone], Social Worker at [phone]   For assessment of decision of mental capacity and competency:  Call Dr. [phone], geriatric psychiatrist at [phone]  For guidance in geriatric dementia issues please call [facility] [phone]  For guidance regarding [facility] and if placement were needed at the facility, contact [phone], Social Worker tel: [phone]  If you have any severe symptoms of a stroke, or other severe issues such as confusion,severe chills or fever, etc call 911 or go to the ER as you may need to be evaluated further   Feel free to visit Facebook page " [email or web address]" for tips of how to care for people with memory problems.      RECOMMENDATIONS FOR ALL PATIENTS WITH MEMORY PROBLEMS: 1. Continue to exercise (Recommend 30 minutes of walking everyday, or 3 hours every week) 2. Increase social interactions - continue going to [place] and enjoy social gatherings with friends and family 3. Eat healthy, avoid fried foods and eat more fruits and vegetables 4. Maintain adequate blood pressure, blood sugar, and blood cholesterol level.  Reducing the risk of stroke and cardiovascular disease also helps promoting better memory. 5. Avoid stressful situations. Live a simple life and avoid aggravations. Organize your time and prepare for the next day in anticipation. 6. Sleep well, avoid any interruptions of sleep and avoid any distractions in the bedroom that may interfere with adequate sleep quality 7. Avoid sugar, avoid sweets as there is a strong link between excessive sugar intake, diabetes, and cognitive impairment We discussed the Mediterranean diet, which has been shown to help patients reduce the risk of progressive memory disorders and reduces cardiovascular risk. This includes eating fish, eat fruits and green leafy vegetables, nuts like almonds and hazelnuts, walnuts, and also use olive oil. Avoid fast foods and fried foods as much as possible. Avoid sweets and sugar as sugar use has been linked to worsening of memory function.  There is always a concern of gradual progression of memory problems. If this is the case, then we may need to adjust level of care according to patient needs. Support, both to the patient and caregiver, should then be put into place.    The Alzheimer's Association is here all day, every day for people facing Alzheimer's disease through our free 24/7 Helpline: [phone]. The Helpline provides reliable information and support to all those who need assistance, such as individuals living with memory loss, Alzheimer's or other dementia, caregivers, health care professionals and the public.  Our highly trained and knowledgeable staff can help you with: Understanding memory loss, dementia and Alzheimer's  Medications and other treatment options  General information about aging and brain health  Skills to  provide quality care and to find the best care from professionals  Legal, financial and living-arrangement decisions Our Helpline also features: Confidential care consultation provided by master's level  clinicians who can help with decision-making support, crisis assistance and education on issues families face every day  Help in a caller's preferred language using our translation service that features more than 200 languages and dialects  Referrals to local community programs, services and ongoing support     FALL PRECAUTIONS: Be cautious when walking. Scan the area for obstacles that may increase the risk of trips and falls. When getting up in the mornings, sit up at the edge of the bed for a few minutes before getting out of bed. Consider elevating the bed at the head end to avoid drop of blood pressure when getting up. Walk always in a well-lit room (use night lights in the walls). Avoid area rugs or power cords from appliances in the middle of the walkways. Use a walker or a cane if necessary and consider physical therapy for balance exercise. Get your eyesight checked regularly.  FINANCIAL OVERSIGHT: Supervision, especially oversight when making financial decisions or transactions is also recommended.  HOME SAFETY: Consider the safety of the kitchen when operating appliances like stoves, microwave oven, and blender. Consider having supervision and share cooking responsibilities until no longer able to participate in those. Accidents with firearms and other hazards in the house should be identified and addressed as well.   ABILITY TO BE LEFT ALONE: If patient is unable to contact 911 operator, consider using LifeLine, or when the need is there, arrange for someone to stay with patients. Smoking is a fire hazard, consider supervision or cessation. Risk of wandering should be assessed by caregiver and if detected at any point, supervision and safe proof recommendations should be instituted.  MEDICATION SUPERVISION: Inability to self-administer medication needs to be constantly addressed. Implement a mechanism to ensure safe administration of the medications.          Mediterranean Diet A  Mediterranean diet refers to food and lifestyle choices that are based on the traditions of countries located on the [employer] [place]. This way of eating has been shown to help prevent certain conditions and improve outcomes for people who have chronic diseases, like kidney disease and heart disease. What are tips for following this plan? Lifestyle  Cook and eat meals together with your family, when possible. Drink enough fluid to keep your urine clear or pale yellow. Be physically active every day. This includes: Aerobic exercise like running or swimming. Leisure activities like gardening, walking, or housework. Get 7-8 hours of sleep each night. If recommended by your health care provider, drink red wine in moderation. This means 1 glass a day for nonpregnant women and 2 glasses a day for men. A glass of wine equals 5 oz (150 mL). Reading food labels  Check the serving size of packaged foods. For foods such as rice and pasta, the serving size refers to the amount of cooked product, not dry. Check the total fat in packaged foods. Avoid foods that have saturated fat or trans fats. Check the ingredients list for added sugars, such as corn syrup. Shopping  At the grocery store, buy most of your food from the areas near the walls of the store. This includes: Fresh fruits and vegetables (produce). Grains, beans, nuts, and seeds. Some of these may be available in unpackaged forms or large amounts (in bulk). Fresh seafood. Poultry and eggs. Low-fat dairy products. Buy  whole ingredients instead of prepackaged foods. Buy fresh fruits and vegetables in-season from local farmers markets. Buy frozen fruits and vegetables in resealable bags. If you do not have access to quality fresh seafood, buy precooked frozen shrimp or canned fish, such as tuna, salmon, or sardines. Buy small amounts of raw or cooked vegetables, salads, or olives from the deli or salad bar at your store. Stock your pantry so you  always have certain foods on hand, such as olive oil, canned tuna, canned tomatoes, rice, pasta, and beans. Cooking  Cook foods with extra-virgin olive oil instead of using butter or other vegetable oils. Have meat as a side dish, and have vegetables or grains as your main dish. This means having meat in small portions or adding small amounts of meat to foods like pasta or stew. Use beans or vegetables instead of meat in common dishes like chili or lasagna. Experiment with different cooking methods. Try roasting or broiling vegetables instead of steaming or sauteing them. Add frozen vegetables to soups, stews, pasta, or rice. Add nuts or seeds for added healthy fat at each meal. You can add these to yogurt, salads, or vegetable dishes. Marinate fish or vegetables using olive oil, lemon juice, garlic, and fresh herbs. Meal planning  Plan to eat 1 vegetarian meal one day each week. Try to work up to 2 vegetarian meals, if possible. Eat seafood 2 or more times a week. Have healthy snacks readily available, such as: Vegetable sticks with hummus. Greek yogurt. Fruit and nut trail mix. Eat balanced meals throughout the week. This includes: Fruit: 2-3 servings a day Vegetables: 4-5 servings a day Low-fat dairy: 2 servings a day Fish, poultry, or lean meat: 1 serving a day Beans and legumes: 2 or more servings a week Nuts and seeds: 1-2 servings a day Whole grains: 6-8 servings a day Extra-virgin olive oil: 3-4 servings a day Limit red meat and sweets to only a few servings a month What are my food choices? Mediterranean diet Recommended Grains: Whole-grain pasta. Brown rice. Bulgar wheat. Polenta. Couscous. Whole-wheat bread. Orpah Cobb. Vegetables: Artichokes. Beets. Broccoli. Cabbage. Carrots. Eggplant. Green beans. Chard. Kale. Spinach. Onions. Leeks. Peas. Squash. Tomatoes. Peppers. Radishes. Fruits: Apples. Apricots. Avocado. Berries. Bananas. Cherries. Dates. Figs. Grapes. Lemons.  Melon. Oranges. Peaches. Plums. Pomegranate. Meats and other protein foods: Beans. Almonds. Sunflower seeds. Pine nuts. Peanuts. Cod. Salmon. Scallops. Shrimp. Tuna. Tilapia. Clams. Oysters. Eggs. Dairy: Low-fat milk. Cheese. Greek yogurt. Beverages: Water. Red wine. Herbal tea. Fats and oils: Extra virgin olive oil. Avocado oil. Grape seed oil. Sweets and desserts: [place] yogurt with honey. Baked apples. Poached pears. Trail mix. Seasoning and other foods: Basil. Cilantro. Coriander. Cumin. Mint. Parsley. Sage. Rosemary. Tarragon. Garlic. Oregano. Thyme. Pepper. Balsalmic vinegar. Tahini. Hummus. Tomato sauce. Olives. Mushrooms. Limit these Grains: Prepackaged pasta or rice dishes. Prepackaged cereal with added sugar. Vegetables: Deep fried potatoes (french fries). Fruits: Fruit canned in syrup. Meats and other protein foods: Beef. Pork. Lamb. Poultry with skin. Hot dogs. Tomasa Blase. Dairy: Ice cream. Sour cream. Whole milk. Beverages: Juice. Sugar-sweetened soft drinks. Beer. Liquor and spirits. Fats and oils: Butter. Canola oil. Vegetable oil. Beef fat (tallow). Lard. Sweets and desserts: Cookies. Cakes. Pies. Candy. Seasoning and other foods: Mayonnaise. Premade sauces and marinades. The items listed may not be a complete list. Talk with your dietitian about what dietary choices are right for you. Summary The Mediterranean diet includes both food and lifestyle choices. Eat a variety of fresh fruits and vegetables, beans, nuts,  seeds, and whole grains. Limit the amount of red meat and sweets that you eat. Talk with your health care provider about whether it is safe for you to drink red wine in moderation. This means 1 glass a day for nonpregnant women and 2 glasses a day for men. A glass of wine equals 5 oz (150 mL). This information is not intended to replace advice given to you by your health care provider. Make sure you discuss any questions you have with your health care provider. Document  Released: 05/31/2016 Document Revised: 07/03/2016 Document Reviewed: 05/31/2016 Elsevier Interactive Patient Education  2017 [employer].

## 2022-06-19 NOTE — Progress Notes (Signed)
Assessment/Plan:   Dementia due to Alzheimer's Disease  Jody Livingston is a very pleasant 71 y.o. LH  female  with a history of HTN, HLD, DM2, CAD, schizophrenia, anxiety, presenting today in follow-up for evaluation of memory loss.  She has a diagnosis of MCI as per neuropsych evaluation, Patient is on donepezil 10 mg daily [name] today is   16/30  with delayed recall  0/3     Recommendations:   Follow up in 6 months. Follow mood with psychiatry Continue donepezil 10 mg daily Memantine 10 mg daily x 2 weeks and then increase to 10 mg 2 times a day Recommend god control of cardiovascular risk factors  Repeat neurocognitive testing  for clarity of diagnosis and disease trajectory     Case discussed with Dr. Delice Lesch who agrees with the plan     Subjective:   This patient is accompanied in the office by her daughter  who supplements the history. Previous records as well as any outside records available were reviewed prior to todays visit.  ***  Patient is currently on    Any changes in memory since last visit? Nott too well, conv in min,  both STM, LTM. Dont do much of anything, watch TV, no CWP  repeats oneself?  Endorsed  Patient lives with: alonne. Kids visit  Disoriented when walking into a room?  Patient denies   Leaving objects in unusual places?  Endorsed lose things  Ambulates  with difficulty?   Patient denies   Recent falls?  Patient denies   Any head injuries?  Patient denies   History of seizures?   Patient denies   Wandering behavior?  Patient denies   Patient drives?   Patient no longer drives  Any mood changes since last visit? Endorsed. Same as before. Gets frustrated at her vrother easily Any worsening depression?:  Patient denies   Hallucinations?  Patient denies   Paranoia? Endorsed, she may acuse sometimes extreme frustrations with ppl taken her money .  Patient reports that sleeps well without vivid dreams, REM behavior or sleepwalking   History  of sleep apnea?  Patient denies   Any hygiene concerns?  Patient denies   Independent of bathing and dressing?  She does bird baths using the sink, sometimes the shower chair Does the patient needs help with medications? ENdorsed, her daughter helps. Pillpack  Who is in charge of the finances? Family helps with the finances Any changes in appetite?  Patient denies   Patient have trouble swallowing? Patient denies   Does the patient cook?  Patient denies   Any kitchen accidents such as leaving the stove on? Patient denies   Any headaches?  Patient denies   Double vision? Patient denies   Any focal numbness or tingling?  Patient denies   Chronic back pain Patient denies   Unilateral weakness?  Patient denies   Any tremors?  Patient denies   Any history of anosmia?  Denies  Any incontinence of urine?  Patient denies   Any bowel dysfunction?   Patient denies          History on Initial Assessment 12/17/2018: This is a 71 year old left-handed woman with a history of hypertension, hyperlipidemia, diabetes, CAD, schizophrenia, presenting for evaluation of epilepsy and depakote level <4. The patient and her daughter Jody Livingston deny any prior history of epilepsy or seizures. They both deny any prior history of convulsions, staring/unresponsive episodes, or any seizure-like symptoms. There are no prior records for review.  She states the Depakote is being prescribed by her psychiatrist Dr. Bayard Males. She states that "I was complaining about memory loss." Jody Livingston does not provide much information and states she does not know much because the patient lives with her other daughter, Jody Livingston. Jody Livingston tried to get in contact with Jody Livingston to provide additional information, and Jody Livingston sent a message: "she sees Dr. Bayard Males for schizophrenia, dementia, COPD, tachycardia, hypertension, diabetes. Why is she not remembering things correctly when supposedly taking all her medication?" The patient states that Jody Livingston lives  with her, that the patient pays the rent and utilities and denies missing bills. She stopped driving 2 years ago, it is unclear what exactly occurred, but she denies getting lost and states family would not bring her. Jody Livingston reports she stopped working 25 years ago, she was driving and did not know where she was going, she would go to work and keep driving, not knowing where she would end up. Jody Livingston reports she forgets to eat. She states she sometimes forgets her medications, and that Jody Livingston does not help her. She puts her medications in a pill caddy and states that her sone comes and "moves them every which way, not in the right places." She states that "he says I don't know what I am doing" and insisted to take her to a psychiatrist. Each time she goes to Dr. Bayard Males and complains about side effects of Depakote, "he asks me to increase it, they are trying to prescribe 4 but I am only taking 1." She states she only started seeing a psychiatrist 2 years ago and that prior to this she had never seen one. Her daughter reminds her that she kept going to the [facility] in [place] 10 years ago and was discharged home on Risperdal.    She denies any headaches, dizziness, diplopia, dysarthria/dysphagia, neck/back pain, focal numbness/tingling/weakness, bowel/bladder dysfunction, tremors. She has noticed a decreased sense of smell. She has had bedwetting since childhood. She denies any olfactory/gustatory hallucinations, rising epigastric sensation, myoclonic jerks. No family history of dementia or seizures. No history of significant head injuries, alcohol intake. No history of febrile convulsions, CNS infections, neurosurgical procedures.     Past Medical History:  Diagnosis Date   Atypical chest pain 04/30/2016   CAD (coronary artery disease)    COPD (chronic obstructive pulmonary disease)    Essential hypertension 04/30/2016   Generalized anxiety disorder    Hyperlipidemia     Hypothyroidism 10/22/2020   Long term (current) use of aspirin 10/22/2020   Long term (current) use of oral hypoglycemic drugs 10/22/2020   Mild neurocognitive disorder 08/31/2019   Onychogryphosis 10/22/2020   Peripheral vascular disease 04/30/2016   Completely occluded left subclavian artery   Personal history of nicotine dependence 10/22/2020   Schizophrenia    Subclavian artery stenosis, left 07/15/2017   Supraventricular tachycardia 04/30/2016   Type 2 diabetes mellitus without complication 04/30/2016   Unspecified asthma, uncomplicated 10/22/2020     History reviewed. No pertinent surgical history.   PREVIOUS MEDICATIONS:   CURRENT MEDICATIONS:  Outpatient Encounter Medications as of 06/19/2022  Medication Sig   ACCU-CHEK AVIVA PLUS test strip SMARTSIG:Via Meter   Accu-Chek FastClix Lancets MISC Apply topically.   clopidogrel (PLAVIX) 75 MG tablet Take 75 mg by mouth daily.   COMBIVENT RESPIMAT 20-100 MCG/ACT AERS respimat INHALE 1 PUFF BY MOUTH 4 TIMES DAILY   D3-1000 25 MCG (1000 UT) capsule Take 1,000 Units by mouth daily.   diltiazem (CARDIZEM CD) 180 MG 24 hr  capsule Take 180 mg by mouth daily.   divalproex (DEPAKOTE ER) 250 MG 24 hr tablet Take 750 mg by mouth at bedtime.    donepezil (ARICEPT) 10 MG tablet Take 1 tablet (10 mg total) by mouth daily.   famotidine (PEPCID) 40 MG tablet Take by mouth.   glimepiride (AMARYL) 4 MG tablet TAKE ONE TABLET BY MOUTH EVERY MORNING FOR DIABETES   metFORMIN (GLUCOPHAGE) 500 MG tablet Take 500 mg by mouth daily.   montelukast (SINGULAIR) 10 MG tablet Take 10 mg by mouth daily.   risperiDONE (RISPERDAL) 0.5 MG tablet Take 0.5 mg by mouth at bedtime.   rosuvastatin (CRESTOR) 20 MG tablet Take 20 mg by mouth daily.    No facility-administered encounter medications on file as of 06/19/2022.     Objective:     PHYSICAL EXAMINATION:    VITALS:   Vitals:   06/19/22 1034  BP: (!) 140/88  Pulse: 74  Resp: 18  SpO2: 98%   Weight: 175 lb (79.4 kg)  Height: 5' (1.524 m)    GEN:  The patient appears stated age and is in NAD. HEENT:  Normocephalic, atraumatic.   Neurological examination:  General: NAD, well-groomed, appears stated age. Orientation: The patient is alert. Oriented to person, place and date Cranial nerves: There is good facial symmetry.The speech is fluent and clear. No aphasia or dysarthria. Fund of knowledge is appropriate. Recent memory impaired and remote memory is normal.  Attention and concentration are normal.  Able to name objects and repeat phrases.  Hearing is intact to conversational tone.    Sensation: Sensation is intact to light touch throughout Motor: Strength is at least antigravity x4. Tremors: none  DTR's 2/4 in UE/LE      07/30/2019    9:00 AM  Montreal Cognitive Assessment   Attention: Read list of digits (0/2) 2  Attention: Read list of letters (0/1) 1  Attention: Serial 7 subtraction starting at 100 (0/3) 0  Language: Repeat phrase (0/2) 1  Language : Fluency (0/1) 0  Abstraction (0/2) 0  Delayed Recall (0/5) 0  Orientation (0/6) 3       12/17/2018    2:00 PM  [name] - Mini Mental State Exam  Orientation to time 5  Orientation to Place 4  Registration 3  Attention/ Calculation 3  Recall 0  Language- name 2 objects 2  Language- repeat 1  Language- follow 3 step command 3  Language- read & follow direction 1  Write a sentence 1  Copy design 0  Total score 23       Movement examination: Tone: There is normal tone in the UE/LE Abnormal movements:  no tremor.  No myoclonus.  No asterixis.   Coordination:  There is no decremation with RAM's. Normal finger to nose  Gait and Station: The patient has no difficulty arising out of a deep-seated chair without the use of the hands. The patient's stride length is good.  Gait is cautious and narrow.   Thank you for allowing [place] the opportunity to participate in the care of this nice patient. Please do not hesitate  to contact us for any questions or concerns.   Total time spent on today's visit was *** minutes dedicated to this patient today, preparing to see patient, examining the patient, ordering tests and/or medications and counseling the patient, documenting clinical information in the EHR or other health record, independently interpreting results and communicating results to the patient/family, discussing treatment and goals, answering patient's questions and  coordinating care.  Cc:  Grayland Jack, FNP  Marlowe Kays 06/19/2022 10:57 AM

## 2022-06-20 DIAGNOSIS — R110 Nausea: Secondary | ICD-10-CM | POA: Diagnosis not present

## 2022-06-20 DIAGNOSIS — K219 Gastro-esophageal reflux disease without esophagitis: Secondary | ICD-10-CM | POA: Diagnosis not present

## 2022-06-20 DIAGNOSIS — R195 Other fecal abnormalities: Secondary | ICD-10-CM | POA: Diagnosis not present

## 2022-06-20 DIAGNOSIS — E118 Type 2 diabetes mellitus with unspecified complications: Secondary | ICD-10-CM | POA: Diagnosis not present

## 2022-06-20 DIAGNOSIS — Z1329 Encounter for screening for other suspected endocrine disorder: Secondary | ICD-10-CM | POA: Diagnosis not present

## 2022-06-20 DIAGNOSIS — R1013 Epigastric pain: Secondary | ICD-10-CM | POA: Diagnosis not present

## 2022-06-20 DIAGNOSIS — F0280 Dementia in other diseases classified elsewhere without behavioral disturbance: Secondary | ICD-10-CM | POA: Diagnosis not present

## 2022-06-20 DIAGNOSIS — Z13228 Encounter for screening for other metabolic disorders: Secondary | ICD-10-CM | POA: Diagnosis not present

## 2022-06-20 DIAGNOSIS — J309 Allergic rhinitis, unspecified: Secondary | ICD-10-CM | POA: Diagnosis not present

## 2022-06-20 DIAGNOSIS — E559 Vitamin D deficiency, unspecified: Secondary | ICD-10-CM | POA: Diagnosis not present

## 2022-06-20 DIAGNOSIS — E782 Mixed hyperlipidemia: Secondary | ICD-10-CM | POA: Diagnosis not present

## 2022-06-21 ENCOUNTER — Other Ambulatory Visit: Payer: Self-pay

## 2022-06-21 ENCOUNTER — Telehealth: Payer: Self-pay | Admitting: Physician Assistant

## 2022-06-21 MED ORDER — MEMANTINE HCL 10 MG PO TABS: ORAL_TABLET | ORAL | 1 refills | Status: AC

## 2022-06-21 NOTE — Telephone Encounter (Signed)
1. Which medications need refilled? (List name and dosage, if known) memantine, 10mg  1 a night for 10 days then twice a day  2. Which pharmacy/location is medication to be sent to? (include street and city if local pharmacy) [facility]  3. Do they need a 30 day or 90 day supply? 30  Per daughter , the pharmacy hasn't received her memantine Rx.  They are gettting her Pillpacks ready for the month, please expedite due to this.

## 2022-06-21 NOTE — Telephone Encounter (Signed)
Sent in prescription and called daughter to make her aware

## 2022-06-28 ENCOUNTER — Ambulatory Visit: Payer: Medicare HMO | Admitting: Podiatry

## 2022-07-05 ENCOUNTER — Ambulatory Visit (INDEPENDENT_AMBULATORY_CARE_PROVIDER_SITE_OTHER): Payer: Self-pay | Admitting: Podiatry

## 2022-07-05 DIAGNOSIS — Z91199 Patient's noncompliance with other medical treatment and regimen due to unspecified reason: Secondary | ICD-10-CM

## 2022-07-05 NOTE — Progress Notes (Signed)
1. No-show for appointment     

## 2022-09-10 ENCOUNTER — Encounter: Payer: Medicare HMO | Admitting: Psychology

## 2022-09-10 ENCOUNTER — Encounter: Payer: Self-pay | Admitting: Psychology

## 2022-09-11 ENCOUNTER — Encounter: Payer: Self-pay | Admitting: Psychology

## 2022-09-21 ENCOUNTER — Encounter: Payer: Medicare HMO | Admitting: Psychology

## 2022-12-19 ENCOUNTER — Encounter: Payer: Self-pay | Admitting: Physician Assistant

## 2023-01-09 ENCOUNTER — Encounter: Payer: Medicare HMO | Admitting: Psychology

## 2023-01-16 ENCOUNTER — Encounter: Payer: Medicare HMO | Admitting: Psychology

## 2023-01-23 ENCOUNTER — Ambulatory Visit: Payer: Medicare HMO | Admitting: Physician Assistant

## 2023-02-07 ENCOUNTER — Ambulatory Visit (INDEPENDENT_AMBULATORY_CARE_PROVIDER_SITE_OTHER): Payer: 59 | Admitting: Physician Assistant

## 2023-02-07 ENCOUNTER — Encounter: Payer: Self-pay | Admitting: Physician Assistant

## 2023-02-07 VITALS — HR 78 | Resp 18 | Ht 60.0 in | Wt 176.0 lb

## 2023-02-07 DIAGNOSIS — R413 Other amnesia: Secondary | ICD-10-CM

## 2023-02-07 NOTE — Patient Instructions (Signed)
Continue Donepezil 10mg  daily  2  Continue Memantine 10 mg  twice a day.     repeat Neuropsychological evaluation 3. Please contact your psychiatrist for refills of your psychiatric medications 4. Follow-up in 6 months, call for any changes  It was a pleasure to see you today at our office.      Whom to call:  Memory  decline, memory medications: Call our office [phone]   For psychiatric meds, mood meds: Please have your primary care physician manage these medications.   Counseling regarding caregiver distress, including caregiver depression, anxiety and issues regarding community resources, adult day care programs, adult living facilities, or memory care questions:   Feel free to contact Misty Lisabeth Register, Social Worker at [phone]   For assessment of decision of mental capacity and competency:  Call Dr. Erick Blinks, geriatric psychiatrist at [phone]  For guidance in geriatric dementia issues please call [facility] [phone]  For guidance regarding [facility] and if placement were needed at the facility, contact Sidney Ace, Social Worker tel: [phone]  If you have any severe symptoms of a stroke, or other severe issues such as confusion,severe chills or fever, etc call 911 or go to the ER as you may need to be evaluated further   Feel free to visit Facebook page " [email or web address]" for tips of how to care for people with memory problems.      RECOMMENDATIONS FOR ALL PATIENTS WITH MEMORY PROBLEMS: 1. Continue to exercise (Recommend 30 minutes of walking everyday, or 3 hours every week) 2. Increase social interactions - continue going to [place] and enjoy social gatherings with friends and family 3. Eat healthy, avoid fried foods and eat more fruits and vegetables 4. Maintain adequate blood pressure, blood sugar, and blood cholesterol level. Reducing the risk of stroke and cardiovascular disease also helps promoting better  memory. 5. Avoid stressful situations. Live a simple life and avoid aggravations. Organize your time and prepare for the next day in anticipation. 6. Sleep well, avoid any interruptions of sleep and avoid any distractions in the bedroom that may interfere with adequate sleep quality 7. Avoid sugar, avoid sweets as there is a strong link between excessive sugar intake, diabetes, and cognitive impairment We discussed the Mediterranean diet, which has been shown to help patients reduce the risk of progressive memory disorders and reduces cardiovascular risk. This includes eating fish, eat fruits and green leafy vegetables, nuts like almonds and hazelnuts, walnuts, and also use olive oil. Avoid fast foods and fried foods as much as possible. Avoid sweets and sugar as sugar use has been linked to worsening of memory function.  There is always a concern of gradual progression of memory problems. If this is the case, then we may need to adjust level of care according to patient needs. Support, both to the patient and caregiver, should then be put into place.    The Alzheimer's Association is here all day, every day for people facing Alzheimer's disease through our free 24/7 Helpline: [phone]. The Helpline provides reliable information and support to all those who need assistance, such as individuals living with memory loss, Alzheimer's or other dementia, caregivers, health care professionals and the public.  Our highly trained and knowledgeable staff can help you with: Understanding memory loss, dementia and Alzheimer's  Medications and other treatment options  General information about aging and brain health  Skills to provide quality care and to find the best care from professionals  Legal, financial and  living-arrangement decisions Our Helpline also features: Confidential care consultation provided by master's level clinicians who can help with decision-making support, crisis assistance and  education on issues families face every day  Help in a caller's preferred language using our translation service that features more than 200 languages and dialects  Referrals to local community programs, services and ongoing support     FALL PRECAUTIONS: Be cautious when walking. Scan the area for obstacles that may increase the risk of trips and falls. When getting up in the mornings, sit up at the edge of the bed for a few minutes before getting out of bed. Consider elevating the bed at the head end to avoid drop of blood pressure when getting up. Walk always in a well-lit room (use night lights in the walls). Avoid area rugs or power cords from appliances in the middle of the walkways. Use a walker or a cane if necessary and consider physical therapy for balance exercise. Get your eyesight checked regularly.  FINANCIAL OVERSIGHT: Supervision, especially oversight when making financial decisions or transactions is also recommended.  HOME SAFETY: Consider the safety of the kitchen when operating appliances like stoves, microwave oven, and blender. Consider having supervision and share cooking responsibilities until no longer able to participate in those. Accidents with firearms and other hazards in the house should be identified and addressed as well.   ABILITY TO BE LEFT ALONE: If patient is unable to contact 911 operator, consider using LifeLine, or when the need is there, arrange for someone to stay with patients. Smoking is a fire hazard, consider supervision or cessation. Risk of wandering should be assessed by caregiver and if detected at any point, supervision and safe proof recommendations should be instituted.  MEDICATION SUPERVISION: Inability to self-administer medication needs to be constantly addressed. Implement a mechanism to ensure safe administration of the medications.          Mediterranean Diet A Mediterranean diet refers to food and lifestyle choices that are based on  the traditions of countries located on the [place]. This way of eating has been shown to help prevent certain conditions and improve outcomes for people who have chronic diseases, like kidney disease and heart disease. What are tips for following this plan? Lifestyle  Cook and eat meals together with your family, when possible. Drink enough fluid to keep your urine clear or pale yellow. Be physically active every day. This includes: Aerobic exercise like running or swimming. Leisure activities like gardening, walking, or housework. Get 7-8 hours of sleep each night. If recommended by your health care provider, drink red wine in moderation. This means 1 glass a day for nonpregnant women and 2 glasses a day for men. A glass of wine equals 5 oz (150 mL). Reading food labels  Check the serving size of packaged foods. For foods such as rice and pasta, the serving size refers to the amount of cooked product, not dry. Check the total fat in packaged foods. Avoid foods that have saturated fat or trans fats. Check the ingredients list for added sugars, such as corn syrup. Shopping  At the grocery store, buy most of your food from the areas near the walls of the store. This includes: Fresh fruits and vegetables (produce). Grains, beans, nuts, and seeds. Some of these may be available in unpackaged forms or large amounts (in bulk). Fresh seafood. Poultry and eggs. Low-fat dairy products. Buy whole ingredients instead of prepackaged foods. Buy fresh fruits and vegetables in-season from local farmers  markets. Buy frozen fruits and vegetables in resealable bags. If you do not have access to quality fresh seafood, buy precooked frozen shrimp or canned fish, such as tuna, salmon, or sardines. Buy small amounts of raw or cooked vegetables, salads, or olives from the deli or salad bar at your store. Stock your pantry so you always have certain foods on hand, such as olive oil, canned tuna, canned  tomatoes, rice, pasta, and beans. Cooking  Cook foods with extra-virgin olive oil instead of using butter or other vegetable oils. Have meat as a side dish, and have vegetables or grains as your main dish. This means having meat in small portions or adding small amounts of meat to foods like pasta or stew. Use beans or vegetables instead of meat in common dishes like chili or lasagna. Experiment with different cooking methods. Try roasting or broiling vegetables instead of steaming or sauteing them. Add frozen vegetables to soups, stews, pasta, or rice. Add nuts or seeds for added healthy fat at each meal. You can add these to yogurt, salads, or vegetable dishes. Marinate fish or vegetables using olive oil, lemon juice, garlic, and fresh herbs. Meal planning  Plan to eat 1 vegetarian meal one day each week. Try to work up to 2 vegetarian meals, if possible. Eat seafood 2 or more times a week. Have healthy snacks readily available, such as: Vegetable sticks with hummus. Greek yogurt. Fruit and nut trail mix. Eat balanced meals throughout the week. This includes: Fruit: 2-3 servings a day Vegetables: 4-5 servings a day Low-fat dairy: 2 servings a day Fish, poultry, or lean meat: 1 serving a day Beans and legumes: 2 or more servings a week Nuts and seeds: 1-2 servings a day Whole grains: 6-8 servings a day Extra-virgin olive oil: 3-4 servings a day Limit red meat and sweets to only a few servings a month What are my food choices? Mediterranean diet Recommended Grains: Whole-grain pasta. Brown rice. Bulgar wheat. Polenta. Couscous. Whole-wheat bread. Orpah Cobb. Vegetables: Artichokes. Beets. Broccoli. Cabbage. Carrots. Eggplant. Green beans. Chard. Kale. Spinach. Onions. Leeks. Peas. Squash. Tomatoes. Peppers. Radishes. Fruits: Apples. Apricots. Avocado. Berries. Bananas. Cherries. Dates. Figs. Grapes. Lemons. Melon. Oranges. Peaches. Plums. Pomegranate. Meats and other protein  foods: Beans. Almonds. Sunflower seeds. Pine nuts. Peanuts. Cod. Salmon. Scallops. Shrimp. Tuna. Tilapia. Clams. Oysters. Eggs. Dairy: Low-fat milk. Cheese. Greek yogurt. Beverages: Water. Red wine. Herbal tea. Fats and oils: Extra virgin olive oil. Avocado oil. Grape seed oil. Sweets and desserts: [place] yogurt with honey. Baked apples. Poached pears. Trail mix. Seasoning and other foods: Basil. Cilantro. Coriander. Cumin. Mint. Parsley. Sage. Rosemary. Tarragon. Garlic. Oregano. Thyme. Pepper. Balsalmic vinegar. Tahini. Hummus. Tomato sauce. Olives. Mushrooms. Limit these Grains: Prepackaged pasta or rice dishes. Prepackaged cereal with added sugar. Vegetables: Deep fried potatoes (french fries). Fruits: Fruit canned in syrup. Meats and other protein foods: Beef. Pork. Lamb. Poultry with skin. Hot dogs. Tomasa Blase. Dairy: Ice cream. Sour cream. Whole milk. Beverages: Juice. Sugar-sweetened soft drinks. Beer. Liquor and spirits. Fats and oils: Butter. Canola oil. Vegetable oil. Beef fat (tallow). Lard. Sweets and desserts: Cookies. Cakes. Pies. Candy. Seasoning and other foods: Mayonnaise. Premade sauces and marinades. The items listed may not be a complete list. Talk with your dietitian about what dietary choices are right for you. Summary The Mediterranean diet includes both food and lifestyle choices. Eat a variety of fresh fruits and vegetables, beans, nuts, seeds, and whole grains. Limit the amount of red meat and sweets that you eat.  Talk with your health care provider about whether it is safe for you to drink red wine in moderation. This means 1 glass a day for nonpregnant women and 2 glasses a day for men. A glass of wine equals 5 oz (150 mL). This information is not intended to replace advice given to you by your health care provider. Make sure you discuss any questions you have with your health care provider. Document Released: 05/31/2016 Document Revised: 07/03/2016 Document Reviewed:  05/31/2016 Elsevier Interactive Patient Education  2017 [employer].

## 2023-02-07 NOTE — Progress Notes (Signed)
Assessment/Plan:   Dementia likely due to Alzheimer disease  Jody Livingston is a very pleasant 72 y.o. RH female with a history of HTN, HLD, DM2, CAD, COPD, schizophrenia, anxiety and initial diagnosis of MCI as per Neuropsych evaluation in the past, with progression of the disease.  She is scheduled for Neuropsych evaluation later this year for clarity of the diagnosis and disease trajectory.  Seen today in follow up for memory loss. Patient is currently on donepezil 10 mg daily.  [name] today is 17/30, stable from prior evaluation except for more difficulties with orientation to time.  He continues to live independently, and able to participate in her ADLs.   Follow up in 6 months. Patient is scheduled for repeat Neuropsych evaluation for clarity of the diagnosis and disease trajectory later this year Continue to follow mood with psychiatry Continue donepezil 10 mg daily, side effects discussed Continue memantine 10 mg twice daily, side effects discussed Continue to control cardiovascular risk factors    Subjective:    This patient is accompanied in the office by her daughter who supplements the history.  Previous records as well as any outside records available were reviewed prior to todays visit. Patient was last seen on 06/19/2022 at which time her [name] was 16/30  Any changes in memory since last visit?  She forgets conversations and minutes according to daughter "progressively worse".  Long and short-term memory are affected at this time.  She does not like to participate in activities outside of the house or do brain games.  She likes to read.   repeats oneself?  Endorsed by her daughter Disoriented when walking into a room?  Patient denies    Leaving objects in unusual places?  Denies, "but loses objects all the time ".  Wandering behavior?  denies   Any personality changes since last visit?  She gets frustrated with her family easily as before. Any worsening depression?:   denies   Hallucinations or paranoia?  denies   Seizures?    denies    Any sleep changes?  Sleeps well. Denies vivid dreams, REM behavior or sleepwalking   Sleep apnea?   denies   Any hygiene concerns?  She does not like to shower, she takes "bird baths ", if she showers she is sits on the chair to prevent falls.  "She hoards all the time " Independent of bathing and dressing?  Endorsed  Does the patient needs help with medications?  Patient is in charge, uses a pillpack but family monitors  Who is in charge of the finances?  Family assists.  She gets very preoccupied with money. Any changes in appetite?  denies    Patient have trouble swallowing?  denies   Any headaches?   denies   Chronic back pain  denies   Ambulates with difficulty?     denies   Recent falls or head injuries? denies     Unilateral weakness, numbness or tingling?    denies   Any tremors?  denies   Any anosmia?  Patient denies   Any incontinence of urine?  denies   Any bowel dysfunction?     denies      Patient lives alone and her children visit her frequently  Does the patient drive? No longer drives       History on Initial Assessment 12/17/2018: This is a 72 year old left-handed woman with a history of hypertension, hyperlipidemia, diabetes, CAD, schizophrenia, presenting for evaluation of epilepsy and Depakote level <  4. The patient and her daughter Jody Livingston deny any prior history of epilepsy or seizures. They both deny any prior history of convulsions, staring/unresponsive episodes, or any seizure-like symptoms. There are no prior records for review. She states the Depakote is being prescribed by her psychiatrist Dr. Bayard Livingston. She states that "I was complaining about memory loss." Jody Livingston does not provide much information and states she does not know much because the patient lives with her other daughter, Jody Livingston. Jody Livingston tried to get in contact with Jody Livingston to provide additional information, and Jody Livingston sent a message: "she  sees Dr. Bayard Livingston for schizophrenia, dementia, COPD, tachycardia, hypertension, diabetes. Why is she not remembering things correctly when supposedly taking all her medication?" The patient states that Jody Livingston lives with her, that the patient pays the rent and utilities and denies missing bills. She stopped driving 2 years ago, it is unclear what exactly occurred, but she denies getting lost and states family would not bring her. Jody Livingston reports she stopped working 25 years ago, she was driving and did not know where she was going, she would go to work and keep driving, not knowing where she would end up. Jody Livingston reports she forgets to eat. She states she sometimes forgets her medications, and that Jody Livingston does not help her. She puts her medications in a pill caddy and states that her sone comes and "moves them every which way, not in the right places." She states that "he says I don't know what I am doing" and insisted to take her to a psychiatrist. Each time she goes to Dr. Bayard Livingston and complains about side effects of Depakote, "he asks me to increase it, they are trying to prescribe 4 but I am only taking 1." She states she only started seeing a psychiatrist 2 years ago and that prior to this she had never seen one. Her daughter reminds her that she kept going to the [facility] in [place] 10 years ago and was discharged home on Risperdal.    She denies any headaches, dizziness, diplopia, dysarthria/dysphagia, neck/back pain, focal numbness/tingling/weakness, bowel/bladder dysfunction, tremors. She has noticed a decreased sense of smell. She has had bedwetting since childhood. She denies any olfactory/gustatory hallucinations, rising epigastric sensation, myoclonic jerks. No family history of dementia or seizures. No history of significant head injuries, alcohol intake. No history of febrile convulsions, CNS infections, neurosurgical procedures.   PREVIOUS MEDICATIONS:   CURRENT  MEDICATIONS:  Outpatient Encounter Medications as of 02/07/2023  Medication Sig   ACCU-CHEK AVIVA PLUS test strip SMARTSIG:Via Meter   Accu-Chek FastClix Lancets MISC Apply topically.   clopidogrel (PLAVIX) 75 MG tablet Take 75 mg by mouth daily.   COMBIVENT RESPIMAT 20-100 MCG/ACT AERS respimat INHALE 1 PUFF BY MOUTH 4 TIMES DAILY   D3-1000 25 MCG (1000 UT) capsule Take 1,000 Units by mouth daily.   diltiazem (CARDIZEM CD) 180 MG 24 hr capsule Take 180 mg by mouth daily.   divalproex (DEPAKOTE ER) 250 MG 24 hr tablet Take 750 mg by mouth at bedtime.    donepezil (ARICEPT) 10 MG tablet Take 1 tablet (10 mg total) by mouth daily.   famotidine (PEPCID) 40 MG tablet Take by mouth.   glimepiride (AMARYL) 4 MG tablet TAKE ONE TABLET BY MOUTH EVERY MORNING FOR DIABETES   memantine (NAMENDA) 10 MG tablet Start memantine 10 mg daily x 2 weeks and then increase to 10 mg 2 times a day if tolerated.   metFORMIN (GLUCOPHAGE) 500 MG tablet Take 500 mg by mouth  daily.   montelukast (SINGULAIR) 10 MG tablet Take 10 mg by mouth daily.   risperiDONE (RISPERDAL) 0.5 MG tablet Take 0.5 mg by mouth at bedtime.   rosuvastatin (CRESTOR) 20 MG tablet Take 20 mg by mouth daily.    No facility-administered encounter medications on file as of 02/07/2023.       02/07/2023   12:00 PM 06/20/2022    6:00 AM 12/17/2018    2:00 PM  [name] - Mini Mental State Exam  Orientation to time 0 0 5  Orientation to Place 1 4 4   Registration 3 3 3   Attention/ Calculation 4 1 3   Recall 0 0 0  Language- name 2 objects 2 2 2   Language- repeat 1 1 1   Language- follow 3 step command 3 2 3   Language- read & follow direction 1 1 1   Write a sentence 1 1 1   Copy design 1 1 0  Total score 17 16 23       07/30/2019    9:00 AM  Montreal Cognitive Assessment   Attention: Read list of digits (0/2) 2  Attention: Read list of letters (0/1) 1  Attention: Serial 7 subtraction starting at 100 (0/3) 0  Language: Repeat phrase (0/2) 1   Language : Fluency (0/1) 0  Abstraction (0/2) 0  Delayed Recall (0/5) 0  Orientation (0/6) 3    Objective:     PHYSICAL EXAMINATION:    VITALS:   Vitals:   02/07/23 1126  Pulse: 78  Resp: 18  SpO2: 95%  Weight: 176 lb (79.8 kg)  Height: 5' (1.524 m)    GEN:  The patient appears stated age and is in NAD. HEENT:  Normocephalic, atraumatic.   Neurological examination:  General: NAD, well-groomed, appears stated age. Orientation: The patient is alert. Oriented to person, not to place or date Cranial nerves: There is good facial symmetry.The speech is fluent and clear. No aphasia or dysarthria. Fund of knowledge is reduced. Recent and remote memory are impaired. Attention and concentration are reduced.  Able to name objects and repeat phrases.  Hearing is intact to conversational tone. Sensation: Sensation is intact to light touch throughout Motor: Strength is at least antigravity x4. DTR's 2/4 in UE/LE     Movement examination: Tone: There is normal tone in the UE/LE Abnormal movements:  no tremor.  No myoclonus.  No asterixis.   Coordination:  There is no decremation with RAM's. Normal finger to nose  Gait and Station: The patient has no difficulty arising out of a deep-seated chair without the use of the hands. The patient's stride length is good.  Gait is cautious and narrow.    Thank you for allowing [place] the opportunity to participate in the care of this nice patient. Please do not hesitate to contact us for any questions or concerns.   Total time spent on today's visit was 20 minutes dedicated to this patient today, preparing to see patient, examining the patient, ordering tests and/or medications and counseling the patient, documenting clinical information in the EHR or other health record, independently interpreting results and communicating results to the patient/family, discussing treatment and goals, answering patient's questions and coordinating care.  Cc:   Grayland Jack, FNP  Marlowe Kays 02/07/2023 12:25 PM

## 2023-02-27 ENCOUNTER — Ambulatory Visit (INDEPENDENT_AMBULATORY_CARE_PROVIDER_SITE_OTHER): Payer: 59 | Admitting: Podiatry

## 2023-02-27 DIAGNOSIS — M79674 Pain in right toe(s): Secondary | ICD-10-CM | POA: Diagnosis not present

## 2023-02-27 DIAGNOSIS — B351 Tinea unguium: Secondary | ICD-10-CM

## 2023-02-27 DIAGNOSIS — L84 Corns and callosities: Secondary | ICD-10-CM | POA: Diagnosis not present

## 2023-02-27 DIAGNOSIS — M79675 Pain in left toe(s): Secondary | ICD-10-CM

## 2023-02-27 DIAGNOSIS — E1151 Type 2 diabetes mellitus with diabetic peripheral angiopathy without gangrene: Secondary | ICD-10-CM | POA: Diagnosis not present

## 2023-02-27 NOTE — Progress Notes (Signed)
       Subjective:  Patient ID: Jody Livingston, female    DOB: 1951-06-28,  MRN: [id number]   Jody Livingston presents to clinic as a new patient today for:  Chief Complaint  Patient presents with   Nail Problem    Diabetic Foot Care    PCP: Pershing Proud, FNP Last Visit with PCP: March 2024  Latest A1C: 8.4 (March 2023)  . Patient notes nails are thick and elongated, causing pain in shoe gear when ambulating.  Her daughter is with her in the exam room today.  PCP is Grayland Jack, FNP.  Allergies  Allergen Reactions   Diazepam    Oxycodone-Aspirin Other (See Comments)   Oxycodone-Acetaminophen Nausea And Vomiting   Objective:  Jody Livingston is a pleasant 72 y.o. female in NAD. AAO x 3.  Vascular Examination: Patient has palpable DP pulse, absent PT pulse bilateral.  Delayed capillary refill bilateral toes.  Sparse digital hair bilateral.  Proximal to distal cooling WNL bilateral.    Dermatological Examination: Interspaces are clear with no open lesions noted bilateral.  Nails are 3-12mm thick, with yellowish/brown discoloration, subungual debris and distal onycholysis x10.  There is pain with compression of nails x10.  There are hyperkeratotic lesions noted right submet 3, left submet 4..  Neurological Examination: Protective sensation intact with Semmes-Weinstein 10 gram monofilament b/l LE.   Musculoskeletal Examination: Muscle strength 5/5 to all LE muscle groups b/l.   Patient qualifies for at-risk foot care because of diabetes with PVD, pain and nails. (Q8).  Assessment/Plan: 1. Type II diabetes mellitus with peripheral circulatory disorder (HCC)   2. Dermatophytosis of nail   3. Corns   4. Pain in toes of both feet     Discussed proper diabetic footcare and daily diabetic foot checks at home with the patient today.  Discussed importance of proper blood glucose control.  The mycotic nails were debrided with sterile nail nippers and  a power debriding bur x 10.  The hyperkeratotic lesions x 2 were shaved with a sterile #312 blade.  Discussed the diabetic shoe program and feel the patient is a good candidate due to the preulcerative lesions on the plantar aspect of the forefoot bilateral as well as the diabetes with PVD.  Will set her up with a shoe consult in the next few weeks.  Recommend follow-up every 3 months for at risk diabetic footcare.  Patient is at risk for developing infection or possible amputation if she attempts to trim the nails and shaved the calluses herself at home.  FOR HOME USE ONLY DME DIABETIC SHOE   Return in about 3 months (around 05/30/2023) for [facility].   Clerance Lav, DPM, FACFAS [facility]     [address] [place], [place] [address]                Office [phone]  Fax [phone]

## 2023-04-02 ENCOUNTER — Ambulatory Visit (INDEPENDENT_AMBULATORY_CARE_PROVIDER_SITE_OTHER): Payer: 59 | Admitting: Podiatry

## 2023-04-02 DIAGNOSIS — L84 Corns and callosities: Secondary | ICD-10-CM

## 2023-04-02 DIAGNOSIS — Q6672 Congenital pes cavus, left foot: Secondary | ICD-10-CM

## 2023-04-02 DIAGNOSIS — M79674 Pain in right toe(s): Secondary | ICD-10-CM

## 2023-04-02 DIAGNOSIS — Q6671 Congenital pes cavus, right foot: Secondary | ICD-10-CM

## 2023-04-02 DIAGNOSIS — M79675 Pain in left toe(s): Secondary | ICD-10-CM

## 2023-04-02 DIAGNOSIS — M2012 Hallux valgus (acquired), left foot: Secondary | ICD-10-CM

## 2023-04-02 DIAGNOSIS — M2011 Hallux valgus (acquired), right foot: Secondary | ICD-10-CM

## 2023-04-02 DIAGNOSIS — E1151 Type 2 diabetes mellitus with diabetic peripheral angiopathy without gangrene: Secondary | ICD-10-CM

## 2023-04-02 NOTE — Progress Notes (Unsigned)
Patient presents today to measured for diabetic shoes and insoles.  Patient was measured for 1 pair of diabetic shoes and 3 pairs of foam casted diabetic insoles.   HT 5' Wt  170lbs Shoe size  5 Type of shoe  a2200w a 2210w a330w   Treating physician  christine williams   Re-appointment for regularly scheduled diabetic foot care visits or if they should experience any trouble with the shoes or insoles.

## 2023-05-29 ENCOUNTER — Encounter: Payer: 59 | Admitting: Podiatry

## 2023-05-29 NOTE — Progress Notes (Signed)
Patient was a no-show for today's scheduled appointment.

## 2023-07-12 ENCOUNTER — Ambulatory Visit: Payer: 59 | Admitting: Psychology

## 2023-07-12 ENCOUNTER — Ambulatory Visit (INDEPENDENT_AMBULATORY_CARE_PROVIDER_SITE_OTHER): Payer: 59 | Admitting: Psychology

## 2023-07-12 ENCOUNTER — Encounter: Payer: Self-pay | Admitting: Psychology

## 2023-07-12 DIAGNOSIS — F0280 Dementia in other diseases classified elsewhere without behavioral disturbance: Secondary | ICD-10-CM

## 2023-07-12 DIAGNOSIS — G309 Alzheimer's disease, unspecified: Secondary | ICD-10-CM | POA: Diagnosis not present

## 2023-07-12 DIAGNOSIS — F209 Schizophrenia, unspecified: Secondary | ICD-10-CM

## 2023-07-12 DIAGNOSIS — R4189 Other symptoms and signs involving cognitive functions and awareness: Secondary | ICD-10-CM

## 2023-07-12 HISTORY — DX: Alzheimer's disease, unspecified: F02.80

## 2023-07-12 NOTE — Progress Notes (Unsigned)
NEUROPSYCHOLOGICAL EVALUATION [place] Department of Neurology  Date of Evaluation: July 12, 2023  Reason for Referral:   Lameka Dynes is a 72 y.o. left-handed Caucasian female referred by Marlowe Kays, PA-C, to characterize her current cognitive functioning and assist with diagnostic clarity and treatment planning in the context of a prior mild neurocognitive disorder diagnosis and concerns for progressive cognitive decline.   Assessment and Plan:   Clinical Impression(s): Ms. Can' pattern of performance is suggestive of severe impairment surrounding both delayed retrieval and recognition/consolidation aspects of memory. Additional impairments were exhibited across cognitive flexibility and verbal fluency (semantic mildly worse than phonemic). Performance variability was exhibited across processing speed, visuospatial abilities, and encoding (i.e., learning) aspects of memory. Performances were appropriate relative to age-matched peers across attention/concentration and confrontation naming. Functionally, her daughter has fully taken over financial management and bill paying. While Ms. Wuest receives medications in pre-packaged packs, she commonly forgets to take said medications or exhibits poor adherence, requiring the assistance of others. She no longer drives. Given the severity of cognitive impairment and the likelihood that it is directly interfering with day-to-day functioning, she best meets diagnostic criteria for a Major Neurocognitive Disorder ("dementia") at the present time.  Relative to her previous evaluation in November 2020, fairly prominent decline was exhibited across processing speed, executive functioning, and clock drawing. Despite different instrumentation, severe memory impairment with consistent 0% retention rates were exhibited across both evaluations. Further stability was exhibited across other domains including  attention/concentration, verbal fluency, confrontation naming, and visuoconstructional abilities.   Regarding the cause of her dementia presentation, I continue to have concerns surrounding an underlying neurodegenerative illness, namely Alzheimer's disease. Despite some limited ability to learn novel story-based information across learning trials, Ms. Kestel was fully amnestic (i.e., 0% retention) after a brief delay across all memory tasks. When asked to re-create a previously drawn figure from memory, she commented "I didn't draw one." She likewise performed very poorly across yes/no recognition trials. Taken together, this suggests evidence for rapid forgetting and a pronounced storage impairment, both of which are the hallmark testing characteristics of this illness. Intact and stable confrontation naming over time is unexpected. However, this would not preclude Alzheimer's disease concerns from being accurate.   As stated at the time of her previous evaluation, schizophrenia can have a very heterogenous presentation when it comes to cognitive dysfunction. Some individuals exhibit minimal cognitive impairment while others exhibit severe impairment directly related to this mental health condition. I am ultimately unable to determine what role this condition is currently playing or not playing in Ms. Allyne Gee' overall presentation. However, it is worth highlighting there is research to suggest that having schizophrenia is associated with an elevated rate of developing Alzheimer's disease later in life. Continued medical monitoring will be important moving forward.   Recommendations: Ms. Bringer has already been prescribed a medication aimed to address memory loss and concerns surrounding Alzheimer's disease (i.e., donepezil/Aricept and memantine/Namenda). She is encouraged to continue taking these medications as prescribed. It is important to highlight that these medications have been shown to slow  functional decline in some individuals. There is no current treatment which can stop or reverse cognitive decline when caused by a neurodegenerative illness.   It will be important for Ms. Welton to have another person with her when in situations where she may need to process information, weigh the pros and cons of different options, and make decisions, in order to ensure that she fully understands  and recalls all information to be considered.  If not already done, Ms. Allyne Gee and her family may want to discuss her wishes regarding durable power of attorney and medical decision making, so that she can have input into these choices. If they require legal assistance with this, long-term care resource access, or other aspects of estate planning, they could reach out to [employer] [facility] [employer] at [phone] for a free consultation. Additionally, they may wish to discuss future plans for caretaking and seek out community options for in home/residential care should they become necessary.  Ms. Kuhr is encouraged to attend to lifestyle factors for brain health (e.g., regular physical exercise, good nutrition habits and consideration of the MIND-DASH diet, regular participation in cognitively-stimulating activities, and general stress management techniques), which are likely to have benefits for both emotional adjustment and cognition. Optimal control of vascular risk factors (including safe cardiovascular exercise and adherence to dietary recommendations) is encouraged. Continued participation in activities which provide mental stimulation and social interaction is also recommended.   Important information should be provided to Ms. House in written format in all instances. This information should be placed in a highly frequented and easily visible location within her home to promote recall. External strategies such as written notes in a consistently used memory journal, visual and nonverbal auditory cues such as a  calendar on the refrigerator or appointments with alarm, such as on a cell phone, can also help maximize recall.  To address problems with processing speed, she may wish to consider:   -Ensuring that she is alerted when essential material or instructions are being presented   -Adjusting the speed at which new information is presented   -Allowing for more time in comprehending, processing, and responding in conversation   -Repeating and paraphrasing instructions or conversations aloud  To address problems with fluctuating attention and/or executive dysfunction, she may wish to consider:   -Avoiding external distractions when needing to concentrate   -Limiting exposure to fast paced environments with multiple sensory demands   -Writing down complicated information and using checklists   -Attempting and completing one task at a time (i.e., no multi-tasking)   -Verbalizing aloud each step of a task to maintain focus   -Taking frequent breaks during the completion of steps/tasks to avoid fatigue   -Reducing the amount of information considered at one time   -Scheduling more difficult activities for a time of day where she is usually most alert  Review of Records:   Ms. Nordby was seen by [facility] Marland KitchenPatrcia Dolly, M.D.) on 07/30/2019 for an evaluation of memory loss. Symptoms were generalized in nature and said to potentially date back 25 years, possibly related to an underlying psychiatric condition. Records suggest a history of schizophrenia and several psychiatric hospitalizations approximately 10 years ago. Bipolar disorder was also suspected, but never diagnosed/confirmed. Ms. Moorhouse was said to live alone and ADLs were described as largely intact. She did acknowledge forgetting to take medications at times, generally her afternoon or evening dosages. Performance on a brief cognitive screening instrument over the telephone (blind MOCA) was abnormal (8/22). Previous performance on the [name]  was also abnormal (23/30). Ultimately, Ms. Calderone was referred for a comprehensive neuropsychological evaluation to characterize her cognitive abilities and to assist with diagnostic clarity and treatment planning.   She completed a comprehensive neuropsychological evaluation with myself on 08/31/2019. Results suggested primary impairments across verbal and visual memory, including exhibiting an amnestic profile across all administered memory tests. Additional weaknesses were noted across  verbal fluency (semantic worse than phonemic) and response inhibition; variability was exhibited across processing speed. No ADL dysfunction was noted and she was diagnosed with a mild neurocognitive disorder. Given memory patterns, concerns were expressed for an underlying neurodegenerative illness such as Alzheimer's disease. There were also concerns surrounding the contribution of acute anxiety and her longer history of schizophrenia. Repeat testing in 12-18 months was recommended.   Ms. Nealon has been seen for follow-up over the years. She most recently met with [facility] Marlowe Kays, [place]) on 02/07/2023 for follow-up. She has tolerated donepezil 10mg  well in the interim. Her daughter reported significant, progressive cognitive decline over time, especially with regard to memory. There did appear to be some ADL dysfunction since her previous evaluation. Performance on the [name] was 17/30. Ultimately, Ms. Pearl was referred for a repeat neuropsychological evaluation to characterize her cognitive abilities and to assist with diagnostic clarity and treatment planning.    Neuroimaging Brain MRI on 01/20/2019 revealed atrophy and small vessel disease with no significant progression relative to 2017 imaging (this was unavailable for review). No other neuroimaging was available for review.  Past Medical History:  Diagnosis Date   Atypical chest pain 04/30/2016   CAD (coronary artery disease)    COPD (chronic  obstructive pulmonary disease)    Dementia due to Alzheimer's disease 07/12/2023   Essential hypertension 04/30/2016   Generalized anxiety disorder    Hyperlipidemia    Hypothyroidism 10/22/2020   Long term (current) use of aspirin 10/22/2020   Long term (current) use of oral hypoglycemic drugs 10/22/2020   Onychogryphosis 10/22/2020   Peripheral vascular disease 04/30/2016   Completely occluded left subclavian artery   Schizophrenia    Subclavian artery stenosis, left 07/15/2017   Supraventricular tachycardia 04/30/2016   Type 2 diabetes mellitus without complication 04/30/2016   Unspecified asthma, uncomplicated 10/22/2020    No past surgical history on file.   Current Outpatient Medications:    ACCU-CHEK AVIVA PLUS test strip, SMARTSIG:Via Meter, Disp: , Rfl:    Accu-Chek FastClix Lancets MISC, Apply topically., Disp: , Rfl:    clopidogrel (PLAVIX) 75 MG tablet, Take 75 mg by mouth daily., Disp: , Rfl:    COMBIVENT RESPIMAT 20-100 MCG/ACT AERS respimat, INHALE 1 PUFF BY MOUTH 4 TIMES DAILY, Disp: , Rfl:    D3-1000 25 MCG (1000 UT) capsule, Take 1,000 Units by mouth daily., Disp: , Rfl:    diltiazem (CARDIZEM CD) 180 MG 24 hr capsule, Take 180 mg by mouth daily., Disp: , Rfl:    divalproex (DEPAKOTE ER) 250 MG 24 hr tablet, Take 750 mg by mouth at bedtime. , Disp: , Rfl:    donepezil (ARICEPT) 10 MG tablet, Take 1 tablet (10 mg total) by mouth daily., Disp: 90 tablet, Rfl: 3   famotidine (PEPCID) 40 MG tablet, Take by mouth., Disp: , Rfl:    glimepiride (AMARYL) 4 MG tablet, TAKE ONE TABLET BY MOUTH EVERY MORNING FOR DIABETES, Disp: , Rfl:    memantine (NAMENDA) 10 MG tablet, Start memantine 10 mg daily x 2 weeks and then increase to 10 mg 2 times a day if tolerated., Disp: 180 tablet, Rfl: 1   metFORMIN (GLUCOPHAGE) 500 MG tablet, Take 500 mg by mouth daily., Disp: , Rfl:    montelukast (SINGULAIR) 10 MG tablet, Take 10 mg by mouth daily., Disp: , Rfl:    risperiDONE (RISPERDAL)  0.5 MG tablet, Take 0.5 mg by mouth at bedtime., Disp: , Rfl:    rosuvastatin (CRESTOR) 20 MG tablet,  Take 20 mg by mouth daily. , Disp: , Rfl:   Clinical Interview:   The following information was obtained during a clinical interview with Ms. Allyne Gee and her daughter prior to cognitive testing.  Cognitive Symptoms: Decreased short-term memory: Endorsed. Ms. Helmuth previously reported symptoms of generalized forgetfulness and was largely unable to provide specific examples. This remained consistent across the current evaluation, with her acknowledging that her memory is "not as good as it used to be." She appeared to exhibit some trouble with comprehension or more direct memory questioning. Her daughter reported prominent rapid forgetting and repetition in day-to-day conversation. Previously, Ms. Allyne Gee noted memory dysfunction being present for about 6-12 months prior to her November 2020 evaluation. Her daughter reported progressive cognitive decline in the time since. Decreased long-term memory: Denied. Decreased attention/concentration: Endorsed. She did describe trouble with increased distractibility. This was said to be stable over time.  Reduced processing speed: Denied. Difficulties with executive functions: Endorsed. Longstanding difficulties with organization were previously reported by her daughter. She also denied observed trouble surrounding multi-tasking. They denied trouble with impulsivity. Her daughter noted some personality changes surrounding increased aggression and irritability. However, this has been improved lately with medication intervention. Difficulties with emotion regulation: Denied. Difficulties with receptive language: Denied. Difficulties with word finding: Denied. Decreased visuoperceptual ability: Denied.  Difficulties completing ADLs: Endorsed. Ms. Witz receives medications in pre-packaged pill packs. Her daughter noted that Ms. Wedding has trouble  remembering to take these medications and that she generally provides assistance in this regard. Her daughter has fully taken over financial management and bill paying. Ms. Rem stopped driving around 0981 for previously unspecified reasons.   Additional Medical History: History of traumatic brain injury/concussion: Denied. History of stroke: Denied. History of seizure activity: Denied. History of known exposure to toxins: Denied. Symptoms of chronic pain: Endorsed. Generalized "aches and pains" throughout her body were previously described as manageable overall. This was stable.  Experience of frequent headaches/migraines: Denied. Frequent instances of dizziness/vertigo: Denied.   Sensory changes: Denied. Balance/coordination difficulties: Denied. However, her daughter noted that Ms. Allyne Gee had experienced two separate falls within the past month. These were not directly witnessed by her daughter. As such, the mechanism or cause of said falls was unclear. No injuries were described.  Other motor difficulties: Denied.  Sleep History: Estimated hours obtained each night: 8-9 hours. Difficulties falling asleep: Denied. Difficulties staying asleep: Denied. Feels rested and refreshed upon awakening: Endorsed.   History of snoring: Denied. History of waking up gasping for air: Denied. Witnessed breath cessation while asleep: Denied.   History of vivid dreaming: Denied. Excessive movement while asleep: Denied. Instances of acting out her dreams: Denied.  Psychiatric/Behavioral Health History: Depression: Denied. Ms. Goldfield described her mood as "I'm all right" and denied being to her knowledge any previous mental health concerns or diagnoses surrounding a depressive condition. Current or remote suicidal ideation, intent, or plan was also denied. Anxiety: Endorsed. Remote symptoms of generalized anxious distress were previously reported, attributed to life stressors while raising her  children. Currently, her daughter did describe some anxious experiences occurring from time to time. Mania: Denied. Trauma History: Denied. Visual/auditory hallucinations: Denied. Her daughter previously alluded to possible auditory hallucinations in the form her mother making comments surrounding a female voice who gives Ms. Allyne Gee permission to go to certain places or wear certain things. Ms. Palu denied current symptoms and her daughter did not describe and known experiences lately.  Delusional thoughts: Denied. Mental health treatment: Records suggest that Ms.  Garrod has been hospitalized several times in the remote past due to symptoms of schizophrenia and possible bipolar disorder. Ms. Jurkiewicz' daughter previously confirmed a remote history of several psychiatric hospitalizations.    Tobacco: Denied. Alcohol: Ms. Herpin denied current alcohol consumption as well as a history of problematic alcohol abuse or dependence.  Recreational drugs: Denied.  Family History: History reviewed. No pertinent family history. This information was confirmed by Ms. Friday.  Academic/Vocational History: Highest level of educational attainment: 7 years. Ms. Anhorn noted completing the 7th grade prior to leaving school; she stated that she left school in order to get married. She described herself as a good (A/B) student in academic settings prior to leaving. History of developmental delay: Denied. History of grade repetition: Denied. Enrollment in special education courses: Denied. History of LD/ADHD: Denied.   Employment: Retired/disability. Previously, she worked as a Associate Professor, as well as in a mill.   Evaluation Results:   Behavioral Observations: Ms. Cacy was accompanied by her daughter, arrived to her appointment on time, and was appropriately dressed and groomed. She appeared alert. Despite her denial of balance concerns during interview, instability was observed. She was noted to sway  back and forth and mildly lose her balance while walking to the interview room. Gross motor functioning appeared intact upon informal observation and no abnormal movements (e.g., tremors) were noted. She was observed to rock back and forth in her chair throughout the interview. Her affect was generally relaxed and positive. Spontaneous speech was fluent and word finding difficulties were not observed during the clinical interview. There was a slight response latency at times. Thought processes were normal in content. There were concerns surrounding trouble with comprehension during interview as she commonly expressed a lack of clarity of how to respond to several questions. Insight into her cognitive difficulties appeared poor and I do not believe that she has full appreciation for the extent of ongoing cognitive impairment.   During testing, she did exhibit some trouble with task comprehension, especially those more complex in nature (TMT B). Sustained attention was appropriate. Task engagement was adequate and she persisted when challenged. She was noted to rock back and forth in her chair throughout testing. She was also noted to fatigue as the evaluation progressed. It was abbreviated in response. Overall, Ms. Brenizer was cooperative with the clinical interview and subsequent testing procedures.   Adequacy of Effort: The validity of neuropsychological testing is limited by the extent to which the individual being tested may be assumed to have exerted adequate effort during testing. Ms. Gerwin expressed her intention to perform to the best of her abilities and exhibited adequate task engagement and persistence. Scores across stand-alone and embedded performance validity measures were variable. However, her sole below expectation performance is believed to be due to true, severe memory impairment rather than poor engagement or attempts to perform poorly. As such, the results of the current evaluation are  believed to be a valid representation of Ms. Shepperson' current cognitive functioning.  Test Results: Ms. Blouin was very disoriented at the time of the current evaluation. She was unable to provide her street address or phone number. She was also unable to state the current month, date, day of the week, time, or name of the current clinic.   Intellectual abilities based upon educational and vocational attainment were estimated to be in the below average range. Premorbid abilities were estimated to be within the average range based upon a single-word reading test.   Processing speed  was somewhat variable, ranging from the exceptionally low to below average normative ranges. Basic attention was above average. More complex attention (e.g., working memory) was below average. Cognitive flexibility was exceptionally low. Other aspects of executive functioning were unable to be assessed due to increased fatigue.  Receptive language abilities were unable to be directly assessed. Outside of a few more complex tasks, Ms. Ambrose did not exhibit prominent difficulties comprehending task instructions and answered all questions asked of her appropriately. Assessed expressive language was somewhat variable. Phonemic fluency was well below average, semantic fluency was exceptionally low, and confrontation naming was average.     Assessed visuospatial/visuoconstructional abilities were variable, ranging from the well below average to average normative ranges. Points were lost on her drawing of a clock due to her placing the numbers in a counter-clockwise order and incorrectly placing the clock hands.    Learning (i.e., encoding) of novel verbal information was somewhat variable, ranging from the exceptionally low to below average normative ranges. Spontaneous delayed recall (i.e., retrieval) of previously learned information was exceptionally low. Retention rates were 0% across a story learning task, 0% across a list  learning task, and 0% across a figure drawing task. Performance across recognition tasks was exceptionally low, suggesting negligible evidence for information consolidation.   Results of emotional screening instruments suggested that recent symptoms of generalized anxiety were in the minimal to mild range, while symptoms of depression were within normal limits. A screening instrument assessing recent sleep quality suggested the presence of minimal sleep dysfunction.  Tables of Scores:   Note: This summary of test scores accompanies the interpretive report and should not be considered in isolation without reference to the appropriate sections in the text. Descriptors are based on appropriate normative data and may be adjusted based on clinical judgment. Terms such as "Within Normal Limits" and "Outside Normal Limits" are used when a more specific description of the test score cannot be determined. Descriptors refer to the current evaluation only.         Percentile - Normative Descriptor > 98 - Exceptionally High 91-97 - Well Above Average 75-90 - Above Average 25-74 - Average 9-24 - Below Average 2-8 - Well Below Average < 2 - Exceptionally Low        Validity: November 2020 Current  DESCRIPTOR        DCT: --- --- --- Within Normal Limits  RBANS EI: --- --- --- Outside Normal Limits        Orientation:       Raw Score Raw Score Percentile   NAB Orientation, Form 1 23/29 14/29 --- ---        Cognitive Screening:       Raw Score Raw Score Percentile   SLUMS: --- 12/30 --- ---        RBANS, Form A: Standard Score/ Scaled Score Standard Score/ Scaled Score Percentile   Total Score --- 62 1 Exceptionally Low  Immediate Memory --- 69 2 Well Below Average    List Learning --- 3 1 Exceptionally Low    Story Memory --- 6 9 Below Average  Visuospatial/Constructional --- 81 10 Below Average    Figure Copy --- 9 37 Average    Line Orientation --- 11/20 3-9 Well Below Average  Language ---  78 7 Well Below Average    Picture Naming --- 9/10 26-50 Average    Semantic Fluency --- 2 <1 Exceptionally Low  Attention --- 82 12 Below Average    Digit Span --- 12  75 Above Average    Coding --- 2 <1 Exceptionally Low  Delayed Memory --- 40 <1 Exceptionally Low    List Recall --- 0/10 <2 Exceptionally Low    List Recognition --- 11/20 <2 Exceptionally Low    Story Recall --- 1 <1 Exceptionally Low    Story Recognition --- 0/12 <1 Exceptionally Low    Figure Recall --- 1 <1 Exceptionally Low    Figure Recognition --- 0/8 1 Exceptionally Low         Intellectual Functioning:       Standard Score Standard Score Percentile   Test of Premorbid Functioning: 96 99 47 Average        Attention/Executive Function:      Trail Making Test (TMT): Raw Score (T Score) Raw Score (T Score) Percentile     Part A 33 secs.,  0 errors (61) 66 secs.,  1 error (41) 18 Below Average    Part B 95 secs.,  1 error (57) Discontinued --- Impaired         NAB Attention Module, Form 1: T Score T Score Percentile     Digits Forward 47 61 86 Above Average    Digits Backwards 53 42 21 Below Average        Language:      Verbal Fluency Test: Raw Score (T Score) Raw Score (T Score) Percentile     Phonemic Fluency (FAS) 17 (32) 15 (33) 5 Well Below Average    Animal Fluency 6 (22) 6 (24) <1 Exceptionally Low         NAB Language Module, Form 1: T Score T Score Percentile     Naming 30/31 (57) 29/31 (49) 46 Average        Visuospatial/Visuoconstruction:       Raw Score Raw Score Percentile   Clock Drawing: 9/10 6/10 --- Impaired        Mood and Personality:       Raw Score Raw Score Percentile   Geriatric Depression Scale: 7 4 --- Within Normal Limits  Geriatric Anxiety Scale: 13 10 --- Minimal    Somatic 10 3 --- Minimal    Cognitive 2 3 --- Mild    Affective 1 4 --- Mild        Additional Questionnaires:       Raw Score Raw Score Percentile   PROMIS Sleep Disturbance Questionnaire: 16 8 ---  None to Slight   Informed Consent and Coding/Compliance:   The current evaluation represents a clinical evaluation for the purposes previously outlined by the referral source and is in no way reflective of a forensic evaluation.   Ms. Magana was provided with a verbal description of the nature and purpose of the present neuropsychological evaluation. Also reviewed were the foreseeable risks and/or discomforts and benefits of the procedure, limits of confidentiality, and mandatory reporting requirements of this provider. The patient was given the opportunity to ask questions and receive answers about the evaluation. Oral consent to participate was provided by the patient.   This evaluation was conducted by Newman Nickels, Ph.D., ABPP-CN, board certified clinical neuropsychologist. Ms. Casasola completed a clinical interview with Dr. Milbert Coulter, billed as one unit [phone], and 100 minutes of cognitive testing and scoring, billed as one unit [phone] and two additional units [id number]. Psychometrist Wallace Keller, B.S. assisted Dr. Milbert Coulter with test administration and scoring procedures. As a separate and discrete service, one unit [id number] and two units [phone] were billed for Dr. Tammy Sours time spent in  interpretation and report writing.

## 2023-07-12 NOTE — Progress Notes (Signed)
   Psychometrician Note   Cognitive testing was administered to Jody Livingston by Wallace Keller, B.S. (psychometrist) under the supervision of Dr. Newman Nickels, Ph.D., licensed psychologist on 07/12/2023. [name] did not appear overtly distressed by the testing session per behavioral observation or responses across self-report questionnaires. Rest breaks were offered.    The battery of tests administered was selected by Dr. Newman Nickels, Ph.D. with consideration to [name]'s current level of functioning, the nature of her symptoms, emotional and behavioral responses during interview, level of literacy, observed level of motivation/effort, and the nature of the referral question. This battery was communicated to the psychometrist. Communication between Dr. Newman Nickels, Ph.D. and the psychometrist was ongoing throughout the evaluation and Dr. Newman Nickels, Ph.D. was immediately accessible at all times. Dr. Newman Nickels, Ph.D. provided supervision to the psychometrist on the date of this service to the extent necessary to assure the quality of all services provided.    Jody Livingston will return within approximately 1-2 weeks for an interactive feedback session with Dr. Milbert Coulter at which time her test performances, clinical impressions, and treatment recommendations will be reviewed in detail. [name] understands she can contact our office should she require our assistance before this time.  A total of 100 minutes of billable time were spent face-to-face with [name] by the psychometrist. This includes both test administration and scoring time. Billing for these services is reflected in the clinical report generated by Dr. Newman Nickels, Ph.D.  This note reflects time spent with the psychometrician and does not include test scores or any clinical interpretations made by Dr. Milbert Coulter. The full report will follow in a separate note.

## 2023-07-18 ENCOUNTER — Ambulatory Visit (INDEPENDENT_AMBULATORY_CARE_PROVIDER_SITE_OTHER): Payer: 59 | Admitting: Psychology

## 2023-07-18 DIAGNOSIS — F0280 Dementia in other diseases classified elsewhere without behavioral disturbance: Secondary | ICD-10-CM | POA: Diagnosis not present

## 2023-07-18 DIAGNOSIS — F209 Schizophrenia, unspecified: Secondary | ICD-10-CM

## 2023-07-18 DIAGNOSIS — G309 Alzheimer's disease, unspecified: Secondary | ICD-10-CM | POA: Diagnosis not present

## 2023-07-18 NOTE — Progress Notes (Signed)
   Neuropsychology Feedback Session Jody Livingston. [facility] Department of Neurology  Reason for Referral:   [name] is a 72 y.o. left-handed Caucasian female referred by Marlowe Kays, PA-C, to characterize her current cognitive functioning and assist with diagnostic clarity and treatment planning in the context of a prior mild neurocognitive disorder diagnosis and concerns for progressive cognitive decline.   Feedback:   [name] completed a comprehensive neuropsychological evaluation on 07/12/2023. Please refer to that encounter for the full report and recommendations. Briefly, results suggested severe impairment surrounding both delayed retrieval and recognition/consolidation aspects of memory. Additional impairments were exhibited across cognitive flexibility and verbal fluency (semantic mildly worse than phonemic). Performance variability was exhibited across processing speed, visuospatial abilities, and encoding (i.e., learning) aspects of memory. Relative to her previous evaluation in November 2020, fairly prominent decline was exhibited across processing speed, executive functioning, and clock drawing. Despite different instrumentation, severe memory impairment with consistent 0% retention rates were exhibited across both evaluations. Further stability was exhibited across other domains including attention/concentration, verbal fluency, confrontation naming, and visuoconstructional abilities. Regarding the cause of her dementia presentation, I continue to have concerns surrounding an underlying neurodegenerative illness, namely Alzheimer's disease. Despite some limited ability to learn novel story-based information across learning trials, [name] was fully amnestic (i.e., 0% retention) after a brief delay across all memory tasks. When asked to re-create a previously drawn figure from memory, she commented "I didn't draw one." She likewise performed very poorly  across yes/no recognition trials. Taken together, this suggests evidence for rapid forgetting and a pronounced storage impairment, both of which are the hallmark testing characteristics of this illness.  [name] was accompanied by her son during the current feedback session. Content of the current session focused on the results of her neuropsychological evaluation. [name] was given the opportunity to ask questions and her questions were answered. She was encouraged to reach out should additional questions arise. A copy of her report was provided at the conclusion of the visit.      One unit [phone] was billed for Dr. Tammy Sours time spent preparing for, conducting, and documenting the current feedback session with [name].

## 2023-08-12 ENCOUNTER — Ambulatory Visit: Payer: 59 | Admitting: Physician Assistant

## 2024-01-21 ENCOUNTER — Ambulatory Visit: Admitting: Podiatry

## 2024-07-03 DIAGNOSIS — G2581 Restless legs syndrome: Secondary | ICD-10-CM | POA: Diagnosis not present

## 2024-07-03 DIAGNOSIS — E559 Vitamin D deficiency, unspecified: Secondary | ICD-10-CM | POA: Diagnosis not present

## 2024-07-03 DIAGNOSIS — Z1389 Encounter for screening for other disorder: Secondary | ICD-10-CM | POA: Diagnosis not present

## 2024-07-03 DIAGNOSIS — E782 Mixed hyperlipidemia: Secondary | ICD-10-CM | POA: Diagnosis not present

## 2024-07-03 DIAGNOSIS — E118 Type 2 diabetes mellitus with unspecified complications: Secondary | ICD-10-CM | POA: Diagnosis not present

## 2024-08-03 ENCOUNTER — Encounter: Payer: Self-pay | Admitting: Podiatry

## 2024-08-03 ENCOUNTER — Ambulatory Visit (INDEPENDENT_AMBULATORY_CARE_PROVIDER_SITE_OTHER): Admitting: Podiatry

## 2024-08-03 DIAGNOSIS — B351 Tinea unguium: Secondary | ICD-10-CM | POA: Diagnosis not present

## 2024-08-03 DIAGNOSIS — G309 Alzheimer's disease, unspecified: Secondary | ICD-10-CM

## 2024-08-03 DIAGNOSIS — M79674 Pain in right toe(s): Secondary | ICD-10-CM | POA: Diagnosis not present

## 2024-08-03 DIAGNOSIS — M79675 Pain in left toe(s): Secondary | ICD-10-CM | POA: Diagnosis not present

## 2024-08-03 DIAGNOSIS — F0280 Dementia in other diseases classified elsewhere without behavioral disturbance: Secondary | ICD-10-CM

## 2024-08-03 DIAGNOSIS — I739 Peripheral vascular disease, unspecified: Secondary | ICD-10-CM

## 2024-08-03 DIAGNOSIS — E1151 Type 2 diabetes mellitus with diabetic peripheral angiopathy without gangrene: Secondary | ICD-10-CM | POA: Diagnosis not present

## 2024-08-03 NOTE — Progress Notes (Unsigned)
       Subjective:  Patient ID: Jody Livingston, female    DOB: Sep 25, 1951,  MRN: [id number]   Jody Livingston presents to clinic as a new patient today for:  Chief Complaint  Patient presents with   [facility]    [facility] unsure about callous, possibly some under the nails, they are extremely long and thick. Son is with her today, no updated med list and unsure of A1c, Plavix is on the current med lis but insure if taking.   Patient notes nails are thick and elongated, causing pain in shoe gear when ambulating.  Her son is with her in the exam room today.  History of diabetes, also noted to have Alzheimer's dementia.  Anticoagulated on Plavix.  Son is unsure of the A1c but he does note that is likely poorly controlled and quite high.  PCP is Trudy Wanda MATSU, FNP.  Allergies  Allergen Reactions   Diazepam    Oxycodone-Aspirin Other (See Comments)   Oxycodone-Acetaminophen Nausea And Vomiting   Objective:  Jody Livingston is a pleasant 73 y.o. female in NAD. AAO x 3.  Vascular Examination: Patient has palpable DP pulse, absent PT pulse bilateral.  Delayed capillary refill bilateral toes.  Sparse digital hair bilateral.  Proximal to distal cooling WNL bilateral.    Dermatological Examination: Interspaces are clear with no open lesions noted bilateral.  Nails are 3-18mm thick, with yellowish/brown discoloration, subungual debris and distal onycholysis x10.  There is pain with compression of nails x10.  No significant hyperkeratotic lesions noted today.  Neurological Examination: Protective sensation intact with Semmes-Weinstein 10 gram monofilament b/l LE.   Musculoskeletal Examination: Muscle strength 5/5 to all LE muscle groups b/l.   Patient qualifies for at-risk foot care because of diabetes with PVD, pain and nails. (Q8).  Assessment/Plan: 1. Type II diabetes mellitus with peripheral circulatory disorder (HCC)   2. Pain due to onychomycosis of toenails of both feet    3. PVD (peripheral vascular disease)   4. Dementia due to Alzheimer's disease     Patient educated on diabetes. Discussed proper diabetic foot care and discussed risks and complications of disease. Educated patient in depth on reasons to return to the office immediately should he/she discover anything concerning or new on the feet. All questions answered. Discussed proper shoes as well.  #Onychomycosis with pain  -Nails palliatively debrided as below. -Educated on self-care - Anticoagulated on Plavix  Procedure: Nail Debridement Rationale: Pain Type of Debridement: manual, sharp debridement. Instrumentation: Nail nipper, rotary burr. Number of Nails: 10   Did recommend that she resume diabetic footcare every 3 months as nails are overgrown today, needs assistance with footcare  None   Return in about 3 months (around 11/03/2024) for Diabetic Foot Care.   Aleighna Wojtas L. Lamount MAUL, AACFAS [facility]     [address] [place], [place] [address]                Office [phone]  Fax [phone]

## 2024-11-02 ENCOUNTER — Ambulatory Visit: Admitting: Podiatry

## 2024-11-02 DIAGNOSIS — M79675 Pain in left toe(s): Secondary | ICD-10-CM

## 2024-11-02 DIAGNOSIS — L84 Corns and callosities: Secondary | ICD-10-CM | POA: Diagnosis not present

## 2024-11-02 DIAGNOSIS — E1151 Type 2 diabetes mellitus with diabetic peripheral angiopathy without gangrene: Secondary | ICD-10-CM | POA: Diagnosis not present

## 2024-11-02 DIAGNOSIS — B351 Tinea unguium: Secondary | ICD-10-CM

## 2024-11-02 DIAGNOSIS — M79674 Pain in right toe(s): Secondary | ICD-10-CM

## 2024-11-02 NOTE — Progress Notes (Signed)
 "      Subjective:  Patient ID: Jody Livingston, female    DOB: 08/21/1951,  MRN: [id number]  Riley Papin presents to clinic today for:  Chief Complaint  Patient presents with   [facility]    [facility] with callus but they are not painful and Dr. Lamount has not shaved them in the past according to son.  A1c was 7 in Nov, he thinks.  Plavix.    Patient notes nails are thick and elongated, causing pain in shoe gear when ambulating.  Medicaid ABN reviewed and signed today for calluses.  She has a small callus left submet 4.  Family member is with her today.  Patient was fairly unresponsive and her family members answering for her for the majority of the appointment.  PCP is Trudy Wanda MATSU, FNP.  Last seen around 09/10/2024  Past Medical History:  Diagnosis Date   Atypical chest pain 04/30/2016   CAD (coronary artery disease)    COPD (chronic obstructive pulmonary disease)    Dementia due to Alzheimer's disease 07/12/2023   Essential hypertension 04/30/2016   Generalized anxiety disorder    Hyperlipidemia    Hypothyroidism 10/22/2020   Long term (current) use of aspirin 10/22/2020   Long term (current) use of oral hypoglycemic drugs 10/22/2020   Onychogryphosis 10/22/2020   Peripheral vascular disease 04/30/2016   Completely occluded left subclavian artery   Schizophrenia    Subclavian artery stenosis, left 07/15/2017   Supraventricular tachycardia 04/30/2016   Type 2 diabetes mellitus without complication 04/30/2016   Unspecified asthma, uncomplicated 10/22/2020   Allergies[1]  Objective:  [name] is a pleasant 74 y.o. female in NAD. AAO x 3.  Vascular Examination: Patient has palpable DP pulse, absent PT pulse bilateral.  Delayed capillary refill bilateral toes.  Sparse digital hair bilateral.  Proximal to distal cooling WNL bilateral.    Dermatological Examination: Interspaces are clear with no open lesions noted bilateral.  Skin is shiny and atrophic  bilateral.  Nails are 5-33mm thick, with yellowish/brown discoloration, subungual debris and distal onycholysis x10.  There is pain with compression of nails x10.  There are hyperkeratotic lesions noted left submet 4.  Patient qualifies for at-risk foot care because of diabetes with PVD.  Assessment/Plan: 1. Pain due to onychomycosis of toenails of both feet   2. Type II diabetes mellitus with peripheral circulatory disorder (HCC)   3. Callus of foot    Mycotic nails x10 were sharply debrided with sterile nail nippers and power debriding burr to decrease bulk and length.  The patient and her family member were presented with the Medicaid ABN for callus shaving.  Medicaid typically does not cover shaving of any calluses.  This was reviewed and signed by the patient.  Hyperkeratotic lesion left submet 4 was shaved with #312 blade.  Return in about 3 months (around 01/31/2025) for [facility].   Awanda CHARM Imperial, DPM, FACFAS [facility]     [address] [place], [place] [address]                Office [phone]  Fax [phone]     [1]  Allergies Allergen Reactions   Diazepam    Oxycodone-Aspirin Other (See Comments)   Oxycodone-Acetaminophen  Nausea And Vomiting   "

## 2025-02-01 ENCOUNTER — Ambulatory Visit: Admitting: Podiatry
